# Patient Record
Sex: Female | Born: 1935 | Race: White | Hispanic: No | Smoking: Former smoker
Health system: Southern US, Community
[De-identification: ages and names within clinical notes are randomized; demographics above are authoritative.]

## PROBLEM LIST (undated history)

## (undated) DIAGNOSIS — D649 Anemia, unspecified: Secondary | ICD-10-CM

## (undated) DIAGNOSIS — B029 Zoster without complications: Secondary | ICD-10-CM

## (undated) DIAGNOSIS — I351 Nonrheumatic aortic (valve) insufficiency: Secondary | ICD-10-CM

## (undated) DIAGNOSIS — G43909 Migraine, unspecified, not intractable, without status migrainosus: Secondary | ICD-10-CM

## (undated) DIAGNOSIS — A809 Acute poliomyelitis, unspecified: Secondary | ICD-10-CM

## (undated) DIAGNOSIS — I10 Essential (primary) hypertension: Secondary | ICD-10-CM

## (undated) HISTORY — DX: Zoster without complications: B02.9

## (undated) HISTORY — DX: Nonrheumatic aortic (valve) insufficiency: I35.1

## (undated) HISTORY — DX: Migraine, unspecified, not intractable, without status migrainosus: G43.909

## (undated) HISTORY — DX: Anemia, unspecified: D64.9

## (undated) HISTORY — DX: Essential (primary) hypertension: I10

---
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
## 1963-05-26 HISTORY — PX: OTHER SURGICAL HISTORY: SHX169

## 2010-05-15 ENCOUNTER — Encounter: Payer: Self-pay | Admitting: Internal Medicine

## 2010-05-15 ENCOUNTER — Inpatient Hospital Stay (HOSPITAL_COMMUNITY)
Admission: EM | Admit: 2010-05-15 | Discharge: 2010-05-20 | Payer: Self-pay | Source: Home / Self Care | Attending: Internal Medicine | Admitting: Internal Medicine

## 2010-05-15 LAB — CONVERTED CEMR LAB
ALT: 22 units/L
AST: 35 units/L
Albumin: 4.1 g/dL
Alkaline Phosphatase: 50 units/L
BUN: 17 mg/dL
CO2: 25 meq/L
Calcium: 9.4 mg/dL
Chloride: 99 meq/L
Creatinine, Ser: 0.93 mg/dL
Glucose, Bld: 100 mg/dL
Potassium: 3.3 meq/L
Sodium: 134 meq/L
Total Bilirubin: 0.4 mg/dL
Total Protein: 5.9 g/dL

## 2010-05-16 ENCOUNTER — Encounter (INDEPENDENT_AMBULATORY_CARE_PROVIDER_SITE_OTHER): Payer: Self-pay | Admitting: Internal Medicine

## 2010-05-16 ENCOUNTER — Encounter: Payer: Self-pay | Admitting: Internal Medicine

## 2010-05-16 LAB — CONVERTED CEMR LAB
Cholesterol: 174 mg/dL
HDL: 76 mg/dL
Hgb A1c MFr Bld: 5.6 %
LDL Cholesterol: 81 mg/dL
TSH: 0.626 microintl units/mL
Triglyceride fasting, serum: 83 mg/dL

## 2010-05-17 ENCOUNTER — Encounter: Payer: Self-pay | Admitting: Internal Medicine

## 2010-05-17 LAB — CONVERTED CEMR LAB
BUN: 14 mg/dL
CO2: 22 meq/L
Calcium: 9.2 mg/dL
Chloride: 100 meq/L
Creatinine, Ser: 0.97 mg/dL
Glucose, Bld: 100 mg/dL
HCT: 27.2 %
Hemoglobin: 8.6 g/dL
MCV: 60.9 fL
Platelets: 250 10*3/uL
Potassium: 3.6 meq/L
RBC: 4.47 M/uL
RDW: 18 %
Sodium: 132 meq/L
WBC: 8.2 10*3/uL

## 2010-05-20 ENCOUNTER — Encounter (INDEPENDENT_AMBULATORY_CARE_PROVIDER_SITE_OTHER): Payer: Self-pay | Admitting: *Deleted

## 2010-05-21 ENCOUNTER — Encounter (INDEPENDENT_AMBULATORY_CARE_PROVIDER_SITE_OTHER): Payer: Self-pay | Admitting: *Deleted

## 2010-05-28 ENCOUNTER — Telehealth (INDEPENDENT_AMBULATORY_CARE_PROVIDER_SITE_OTHER): Payer: Self-pay | Admitting: *Deleted

## 2010-05-29 ENCOUNTER — Encounter (HOSPITAL_COMMUNITY)
Admission: RE | Admit: 2010-05-29 | Discharge: 2010-06-24 | Payer: Self-pay | Source: Home / Self Care | Attending: Cardiology | Admitting: Cardiology

## 2010-05-29 ENCOUNTER — Encounter: Payer: Self-pay | Admitting: Internal Medicine

## 2010-05-29 ENCOUNTER — Ambulatory Visit: Admission: RE | Admit: 2010-05-29 | Discharge: 2010-05-29 | Payer: Self-pay | Source: Home / Self Care

## 2010-06-02 ENCOUNTER — Other Ambulatory Visit: Payer: Self-pay | Admitting: Physician Assistant

## 2010-06-02 ENCOUNTER — Ambulatory Visit
Admission: RE | Admit: 2010-06-02 | Discharge: 2010-06-02 | Payer: Self-pay | Source: Home / Self Care | Attending: Physician Assistant | Admitting: Physician Assistant

## 2010-06-02 DIAGNOSIS — R002 Palpitations: Secondary | ICD-10-CM | POA: Insufficient documentation

## 2010-06-02 DIAGNOSIS — I10 Essential (primary) hypertension: Secondary | ICD-10-CM | POA: Insufficient documentation

## 2010-06-02 DIAGNOSIS — E876 Hypokalemia: Secondary | ICD-10-CM | POA: Insufficient documentation

## 2010-06-02 DIAGNOSIS — D509 Iron deficiency anemia, unspecified: Secondary | ICD-10-CM | POA: Insufficient documentation

## 2010-06-02 LAB — BASIC METABOLIC PANEL
BUN: 25 mg/dL — ABNORMAL HIGH (ref 6–23)
CO2: 27 mEq/L (ref 19–32)
Calcium: 9.4 mg/dL (ref 8.4–10.5)
Chloride: 102 mEq/L (ref 96–112)
Creatinine, Ser: 0.8 mg/dL (ref 0.4–1.2)
GFR: 72.01 mL/min (ref >60.00)
Glucose, Bld: 87 mg/dL (ref 70–99)
Potassium: 4.4 mEq/L (ref 3.5–5.1)
Sodium: 136 mEq/L (ref 135–145)

## 2010-06-25 ENCOUNTER — Encounter: Payer: Self-pay | Admitting: Internal Medicine

## 2010-06-26 ENCOUNTER — Ambulatory Visit (INDEPENDENT_AMBULATORY_CARE_PROVIDER_SITE_OTHER): Payer: Medicare Other | Admitting: Internal Medicine

## 2010-06-26 ENCOUNTER — Other Ambulatory Visit: Payer: Self-pay | Admitting: Internal Medicine

## 2010-06-26 ENCOUNTER — Ambulatory Visit: Admit: 2010-06-26 | Payer: Self-pay | Admitting: Internal Medicine

## 2010-06-26 ENCOUNTER — Other Ambulatory Visit: Payer: Medicare Other

## 2010-06-26 ENCOUNTER — Encounter: Payer: Self-pay | Admitting: Internal Medicine

## 2010-06-26 DIAGNOSIS — I10 Essential (primary) hypertension: Secondary | ICD-10-CM | POA: Insufficient documentation

## 2010-06-26 DIAGNOSIS — D509 Iron deficiency anemia, unspecified: Secondary | ICD-10-CM

## 2010-06-26 DIAGNOSIS — Z87891 Personal history of nicotine dependence: Secondary | ICD-10-CM | POA: Insufficient documentation

## 2010-06-26 DIAGNOSIS — Z87448 Personal history of other diseases of urinary system: Secondary | ICD-10-CM | POA: Insufficient documentation

## 2010-06-26 LAB — CBC WITH DIFFERENTIAL/PLATELET
Basophils Absolute: 0 10*3/uL (ref 0.0–0.1)
Basophils Relative: 0.3 % (ref 0.0–3.0)
Eosinophils Absolute: 0 10*3/uL (ref 0.0–0.7)
Eosinophils Relative: 0.3 % (ref 0.0–5.0)
HCT: 35.5 % — ABNORMAL LOW (ref 36.0–46.0)
Hemoglobin: 12 g/dL (ref 12.0–15.0)
Lymphocytes Relative: 13.7 % (ref 12.0–46.0)
Lymphs Abs: 0.9 10*3/uL (ref 0.7–4.0)
MCHC: 33.7 g/dL (ref 30.0–36.0)
MCV: 76.6 fl — ABNORMAL LOW (ref 78.0–100.0)
Monocytes Absolute: 0.4 10*3/uL (ref 0.1–1.0)
Monocytes Relative: 5.9 % (ref 3.0–12.0)
Neutro Abs: 5.5 10*3/uL (ref 1.4–7.7)
Neutrophils Relative %: 79.8 % — ABNORMAL HIGH (ref 43.0–77.0)
Platelets: 223 10*3/uL (ref 150.0–400.0)
RBC: 4.64 Mil/uL (ref 3.87–5.11)
RDW: 30.3 % — ABNORMAL HIGH (ref 11.5–14.6)
WBC: 6.9 10*3/uL (ref 4.5–10.5)

## 2010-06-26 NOTE — Assessment & Plan Note (Signed)
Summary: eph. f/u on stress test. gd   Primary Provider:  Dr. Felicity Coyer (to est 06/2010)   History of Present Illness: Olivia Gay is a 77 yo female with a history of migraines and polio as a child who recently was evaluated at [facility] with complaints of palpitations.  She was diagnosed with iron deficiency anemia.  She also had hypokalemia noted and this was repleted.  Her CK MBs were mildly elevated but her indices were negative.  Her troponins were negative x4.  Cardiology was asked to see her.  She had an echocardiogram which demonstrated an EF of 65%, mild to moderate aortic insufficiency, mild mitral regurgitation, evidence of diastolic dysfunction and septal thickening greater than the posterior wall (no SAM was noted).  Recommendations were for followup with an outpatient Myoview study.  This was done 05/29/10.  This demonstrated an EF of 67% and no ischemia.  She is to followup with primary care for her iron deficiency anemia.   She was  treated for urinary tract infection.  Of note, it was suggested to proceed with outpatient monitoring should she have recurrent or persistent palpitations.  She returns today for followup.  LABS: K 4.4 and Creat 0.85 (12/25); Hgb 8.6 (12/24) During hosp 04/2010: TC 174, TG 83, HDL 76; LFTs ok; TSH 0.626 Chest CT 04/2010: no PE  She returns for followup.  She denies any chest discomfort or shortness of breath.  She denies orthopnea or PND.  She has some mild pedal edema.  She denies any further palpitations.  She sees her new primary care physician next month.  She's also been set up for an upper endoscopy and colonoscopy early next month.  She is taking iron.  Her son is with her and shows me a list of her blood pressures over the last couple weeks.  They have steadily come down to 150s over 70s.  Her gait is steady.  She is tolerating her medications well.  She has noticed some small bruising on her lower extremities.  Current Medications  (verified): 1)  Amlodipine Besylate 10 Mg Tabs (Amlodipine Besylate) .... Take One Tablet By Mouth Daily 2)  Aspirin 81 Mg Tbec (Aspirin) .... Take One Tablet By Mouth Daily 3)  Enalapril Maleate 10 Mg Tabs (Enalapril Maleate) .... Take One Tablet By Mouth Twice A Day 4)  Ferrous Sulfate 325 (65 Fe) Mg  Tabs (Ferrous Sulfate) .Marland Kitchen.. 1 Tab By Mouth Once Daily 5)  Lopressor 12.5 Mg .... 1/2 Tab Two Times A Day 6)  Vit C .... 1 Tab By Mouth Once Daily 7)  Vit D .... 1 Tab By Mouth Once Daily 8)  Fish Oil   Oil (Fish Oil) .Marland Kitchen.. 1 Tab By Mouth Once Daily  Allergies: No Known Drug Allergies  Past History:  Past Medical History: Migraines History of polio in childhood.  HTN Iron deficiency anemia Hyponatremia Echo 04/2010:   EF 65%; diast dysfxn; mild-mod AI; mild MR Myoview 05/2010: EF 67%; no ischemia  Past Surgical History: Reviewed history from 05/30/2010 and no changes required.  None.   Family History: Mother died from an MI at the age of 32.   Her father died of an MI in his late 51s.  Social History: Reviewed history from 05/30/2010 and no changes required.   [place], [place].  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  She has remote   history of smoking, but quit smoking over  50 years ago.  No history of   alcohol abuse or illicit drug use.      Review of Systems       As per  the HPI.  All other systems reviewed and negative.   Vital Signs:  Patient profile:   77 year old female Height:      67 inches Weight:      110 pounds BMI:     17.29 Pulse rate:   61 / minute Resp:     14 per minute BP sitting:   157 / 70  (left arm)  Vitals Entered By: Kem Parkinson (June 02, 2010 1:23 PM)  Physical Exam  General:  Well nourished, well developed, in no acute distress HEENT: normal Neck: no JVD Cardiac:  normal S1, S2; RRR; no murmur Lungs:  clear to auscultation bilaterally, no wheezing, rhonchi or rales Abd:  soft, nontender, no hepatomegaly Ext: trace edema Vascular: scattered purpura noted on bilat LE Skin: warm and dry Neuro:  CNs 2-12 intact, no focal abnormalities noted    Impression & Recommendations:  Problem # 1:  PALPITATIONS (ICD-785.1) This was in the setting of untreated high blood pressure, newly diagnosed anemia and hypokalemia.  She has not had any further palpitations.  She is currently on iron.  Her blood pressure is much better controlled.  She had a Myoview study last week.  As noted above, this demonstrated no ischemia.  Her ejection fraction is normal.  At this point, I do not think it is necessary to proceed with the event monitoring.  She knows to contact us if she develops recurrent palpitations.  She will be brought back in followup with Dr. Jens Som in the next 2 months. (She was seen by our cardiology fellow over the holiday weekend initially in the hospital.  She states that she did see Dr. Jens Som and Dr. Johney Frame on rounds during her stay.  Therefore, I have decided to bring her back to establish with Dr. Jens Som in the office.)  Problem # 2:  ESSENTIAL HYPERTENSION, BENIGN (ICD-401.1) BP is better controlled. Continue current medications.  With her advanced age and h/o unsteady gait (she had balance problems shortly after starting medications in the hospital), I would suggest her medications be slowly titrated.  Her BPs at home are consistently improving. Consider increasing her enalapril over time if her BP remains over 140/90.  Orders: TLB-BMP (Basic Metabolic Panel-BMET) (80048-METABOL)  Problem # 3:  ANEMIA, IRON DEFICIENCY (ICD-280.9) She is to see Dr. Felicity Coyer in Feb to establish. She has an EGD and Colo set up for early Feb as well. With a negative myoview and no objective evidence of CAD or ischemia, I suggest she d/c the ASA.  This should help clear up the purpura she has noticed recently.  Problem # 4:  HYPOKALEMIA (ICD-276.8) Repeat BMET today,  especially since she just started ACE inhibitor recently.  Patient Instructions: 1)  Your physician recommends that you schedule a follow-up appointment in: 2 months with Dr. Jens Som 2)  Your physician has recommended you make the following change in your medication: Stop aspirin.  Continue all other medications on your medication list. Prescriptions: ENALAPRIL MALEATE 10 MG TABS (ENALAPRIL MALEATE) Take one tablet by mouth twice a day  #60 x 6   Entered by:   Dossie Arbour, RN, BSN   Authorized by:   Tereso Newcomer PA-C   Signed by:   Dossie Arbour, RN, BSN on 06/02/2010   Method used:  Electronically to        [facility] [address] [phone]* (retail)       [address]       [place], [place]  [address]       Ph: [phone] or [phone]       Fax: [phone]   RxID:   [id number]  I have personally reviewed the prescriptions today for accuracy.Tereso Newcomer PA-C  June 02, 2010 3:56 PM

## 2010-06-26 NOTE — Progress Notes (Signed)
Summary: Nuclear Pre-Proocedure  Phone Note Outgoing Call   Call placed by: Milana Na, EMT-P,  May 28, 2010 3:54 PM Summary of Call: Left message with information on Myoview Information Sheet (see scanned document for details).      Nuclear Med Background Indications for Stress Test: Evaluation for Ischemia, Post Hospital  Indications Comments: 05/20/10 Palpitations, Uncontrolled HTN, UTI, and unsteady gait  History: Echo  History Comments: 12/11 ECHO EF 65%   Symptoms: Palpitations    Nuclear Pre-Procedure Cardiac Risk Factors: Family History - CAD, History of Smoking, Hypertension  Nuclear Med Study Referring MD:  B.Crenshaw

## 2010-06-26 NOTE — Letter (Signed)
Summary: New Patient letter  [facility]  [address] [place], [place] [address]   Phone: [phone]  Fax: [phone]       05/21/2010 MRN: [id number]  Olivia Gay [address] [place], [place]  [address]  Dear Olivia Gay,  Welcome to the Gastroenterology Division at [facility].    You are scheduled to see Dr.  Arlyce Dice on 07-02-10 at 10:45a.m. on the 3rd floor at [facility], [address].  We ask that you try to arrive at ouroffice 15 minutes prior to your appointment time to allow for check-in.  We would like you to complete the enclosed self-administered evaluation form prior to your visit and bring it with you on the day of your appointment.  We will review it with you.  Also, please bring a complete list of all your medications or, if you prefer, bring the medication bottles and we will list them.  Please bring your insurance card so that we may make a copy of it.  If your insurance requires a referral to see a specialist, please bring your referral form from your primary care physician.  Co-payments are due at the time of your visit and may be paid by cash, check or credit card.     Your office visit will consist of a consult with your physician (includes a physical exam), any laboratory testing he/she may order, scheduling of any necessary diagnostic testing (e.g. x-ray, ultrasound, CT-scan), and scheduling of a procedure (e.g. Endoscopy, Colonoscopy) if required.  Please allow enough time on your schedule to allow for any/all of these possibilities.    If you cannot keep your appointment, please call [phone] to cancel or reschedule prior to your appointment date.  This allows [place] the opportunity to schedule an appointment for another patient in need of care.  If you do not cancel or reschedule by 5 p.m. the business day prior to your appointment date, you will be charged a $50.00 late cancellation/no-show fee.    Thank you for choosing  [facility] for your medical needs.  We appreciate the opportunity to care for you.  Please visit [place] at our website  to learn more about our practice.                     Sincerely,                                                             The Gastroenterology Division

## 2010-06-26 NOTE — Assessment & Plan Note (Signed)
Summary: Cardiology Nuclear Testing  Nuclear Med Background Indications for Stress Test: Evaluation for Ischemia, Post Hospital  Indications Comments: 05/20/10 Palpitations, elevated CK-MB, normal troponin  History: Echo  History Comments: 12/11 Echo:EF 65%, mild-moderate AR, mild MR   Symptoms: Fatigue, Palpitations    Nuclear Pre-Procedure Cardiac Risk Factors: Family History - CAD, History of Smoking, Hypertension Caffeine/Decaff Intake: Decaff coffee at 6:00 am NPO After: 6:00 AM Lungs: Clear IV 0.9% NS with Angio Cath: 22g     IV Site: R Antecubital IV Started by: Irean Hong, RN Chest Size (in) 34     Cup Size B     Height (in): 67 Weight (lb): 107 BMI: 16.82 Tech Comments: Metoprolol held x 12 hours. Patient changed from Lexiscan to Bruce protocol due to caffeine intake.  Nuclear Med Study 1 or 2 day study:  1 day     Stress Test Type:  Stress Reading MD:  Dietrich Pates, MD     Referring MD:  Olga Millers, MD Resting Radionuclide:  Technetium 43m Tetrofosmin     Resting Radionuclide Dose:  11.0 mCi  Stress Radionuclide:  Technetium 50m Tetrofosmin     Stress Radionuclide Dose:  33.0 mCi   Stress Protocol Exercise Time (min):  4:00 min     Max HR:  122 bpm     Predicted Max HR:  139 bpm  Max Systolic BP: 177 mm Hg     Percent Max HR:  87.77 %     METS: 4.8 Rate Pressure Product:  56213    Stress Test Technologist:  Rea College, CMA-N     Nuclear Technologist:  Doyne Keel, CNMT  Rest Procedure  Myocardial perfusion imaging was performed at rest 45 minutes following the intravenous administration of Technetium 39m Tetrofosmin.  Stress Procedure  The patient exercised for four minutes.  The patient stopped due to fatigue and denied any chest pain.  There were no significant ST-T wave changes.  Technetium 33m Tetrofosmin was injected at peak exercise and myocardial perfusion imaging was performed after a brief delay.  QPS Raw Data Images:  Normal; no motion  artifact; normal heart/lung ratio. Stress Images:  Normal homogeneous uptake in all areas of the myocardium. Rest Images:  Normal homogeneous uptake in all areas of the myocardium. Subtraction (SDS):  No evidence of ischemia. Transient Ischemic Dilatation:  1.04  (Normal <1.22)  Lung/Heart Ratio:  0.22  (Normal <0.45)  Quantitative Gated Spect Images QGS EDV:  76 ml QGS ESV:  25 ml QGS EF:  67 %   Overall Impression  Exercise Capacity: Fair exercise capacity. BP Response: Normal blood pressure response. Clinical Symptoms: No chest pain ECG Impression: No significant ST segment change suggestive of ischemia. Overall Impression: Normal stress nuclear study.  Appended Document: Cardiology Nuclear Testing not my pt; forward to ordering physician

## 2010-07-02 ENCOUNTER — Encounter: Payer: Self-pay | Admitting: Gastroenterology

## 2010-07-02 ENCOUNTER — Ambulatory Visit (INDEPENDENT_AMBULATORY_CARE_PROVIDER_SITE_OTHER): Payer: Medicare Other | Admitting: Gastroenterology

## 2010-07-02 DIAGNOSIS — D509 Iron deficiency anemia, unspecified: Secondary | ICD-10-CM
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
## 2010-07-03 ENCOUNTER — Encounter (INDEPENDENT_AMBULATORY_CARE_PROVIDER_SITE_OTHER): Payer: Self-pay | Admitting: *Deleted

## 2010-07-03 ENCOUNTER — Other Ambulatory Visit: Payer: Medicare Other

## 2010-07-03 ENCOUNTER — Other Ambulatory Visit: Payer: Self-pay | Admitting: Gastroenterology

## 2010-07-03 DIAGNOSIS — Z1289 Encounter for screening for malignant neoplasm of other sites: Secondary | ICD-10-CM

## 2010-07-03 LAB — HEMOCCULT SLIDES (X 3 CARDS)
Fecal Occult Blood: NEGATIVE
OCCULT 2: NEGATIVE
OCCULT 3: NEGATIVE
OCCULT 5: NEGATIVE

## 2010-07-10 ENCOUNTER — Encounter: Payer: Medicare Other | Admitting: Gastroenterology

## 2010-07-10 NOTE — Letter (Signed)
Summary: Results Letter  [facility]  [address] [place], [place] [address]   Phone: [phone]  Fax: [phone]        July 02, 2010 MRN: [id number]    [facility] [address] [place], [place]  [address]    Dear Ms. Prunty,  It is my pleasure to have treated you recently as a new patient in my office. I appreciate your confidence and the opportunity to participate in your care.  Since I do have a busy inpatient endoscopy schedule and office schedule, my office hours vary weekly. I am, however, available for emergency calls everyday through my office. If I am not available for an urgent office appointment, another one of our gastroenterologist will be able to assist you.  My well-trained staff are prepared to help you at all times. For emergencies after office hours, a physician from our Gastroenterology section is always available through my 24 hour answering service  Once again I welcome you as a new patient and I look forward to a happy and healthy relationship             Sincerely,  Louis Meckel MD  This letter has been electronically signed by your physician.  Appended Document: Results Letter LETTER MAILED
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
## 2010-07-10 NOTE — Letter (Signed)
Summary: [facility] Instructions  [facility]  [address] [place], [place] [address]   Phone: [phone]  Fax: [phone]       LUCILL MAUCK    75-17-1928    MRN: [id number]        Procedure Day /Date:THURSDAY 07/10/2010     Arrival Time:2:30PM     Procedure Time:3:30PM     Location of Procedure:                    X   [facility] (4th Floor)                        PREPARATION FOR COLONOSCOPY WITH MOVIPREP   Starting 5 days prior to your procedure 07/05/2010  do not eat nuts, seeds, popcorn, corn, beans, peas,  salads, or any raw vegetables.  Do not take any fiber supplements (e.g. Metamucil, Citrucel, and Benefiber).  THE DAY BEFORE YOUR PROCEDURE         DATE: 07/09/2010  DAY: WED  1.  Drink clear liquids the entire day-NO SOLID FOOD  2.  Do not drink anything colored red or purple.  Avoid juices with pulp.  No orange juice.  3.  Drink at least 64 oz. (8 glasses) of fluid/clear liquids during the day to prevent dehydration and help the prep work efficiently.  CLEAR LIQUIDS INCLUDE: Water Jello Ice Popsicles Tea (sugar ok, no milk/cream) Powdered fruit flavored drinks Coffee (sugar ok, no milk/cream) Gatorade Juice: apple, white grape, white cranberry  Lemonade Clear bullion, consomm, broth Carbonated beverages (any kind) Strained chicken noodle soup Hard Candy                             4.  In the morning, mix first dose of MoviPrep solution:    Empty 1 Pouch A and 1 Pouch B into the disposable container    Add lukewarm drinking water to the top line of the container. Mix to dissolve    Refrigerate (mixed solution should be used within 24 hrs)  5.  Begin drinking the prep at 5:00 p.m. The MoviPrep container is divided by 4 marks.   Every 15 minutes drink the solution down to the next mark (approximately 8 oz) until the full liter is complete.   6.  Follow completed prep with 16 oz of clear liquid of your choice (Nothing  red or purple).  Continue to drink clear liquids until bedtime.  7.  Before going to bed, mix second dose of MoviPrep solution:    Empty 1 Pouch A and 1 Pouch B into the disposable container    Add lukewarm drinking water to the top line of the container. Mix to dissolve    Refrigerate  THE DAY OF YOUR PROCEDURE      DATE: 07/10/2010 DAY: THURSDAY  Beginning at10:30a.m. (5 hours before procedure):         1. Every 15 minutes, drink the solution down to the next mark (approx 8 oz) until the full liter is complete.  2. Follow completed prep with 16 oz. of clear liquid of your choice.    3. You may drink clear liquids until 1:30PM  (2 HOURS BEFORE PROCEDURE).   MEDICATION INSTRUCTIONS  Unless otherwise instructed, you should take regular prescription medications with a small sip of water   as early as possible the morning of your procedure.  OTHER INSTRUCTIONS  You will need a responsible adult at least 75 years of age to accompany you and drive you home.   This person must remain in the waiting room during your procedure.  Wear loose fitting clothing that is easily removed.  Leave jewelry and other valuables at home.  However, you may wish to bring a book to read or  an iPod/MP3 player to listen to music as you wait for your procedure to start.  Remove all body piercing jewelry and leave at home.  Total time from sign-in until discharge is approximately 2-3 hours.  You should go home directly after your procedure and rest.  You can resume normal activities the  day after your procedure.  The day of your procedure you should not:   Drive   Make legal decisions   Operate machinery   Drink alcohol   Return to work  You will receive specific instructions about eating, activities and medications before you leave.    The above instructions have been reviewed and explained to me by   _______________________    I fully understand and can verbalize these  instructions _____________________________ Date _________

## 2010-07-10 NOTE — Discharge Summary (Signed)
Olivia Gay, Olivia Gay             ACCOUNT NO.:  [id number]      MEDICAL RECORD NO.:  [id number]          PATIENT TYPE:  INP      LOCATION:  2038                         FACILITY:  [facility]      PHYSICIAN:  Calvert Cantor, M.D.     DATE OF BIRTH:  November 07, 1926      DATE OF ADMISSION:  05/15/2010   DATE OF DISCHARGE:                                  DISCHARGE SUMMARY         PRIMARY CARE PHYSICIAN:  The patient does not have one.      She is being referred to Dr. Rene Paci at [facility].      IN-HOSPITAL CONSULTS:  Cardiology consult with [facility].      CHIEF COMPLAINT:  Palpitations.      DISCHARGE DIAGNOSES:   1. Palpitations, possibly secondary to premature ventricular       contractions.   2. Uncontrolled high blood pressure on admission.   3. Anemia, microcytic with iron deficiency.  The patient will need a       GI workup as an outpatient.   4. Unsteady gait.  The patient will need home health PT.  She is being       discharged with a walker.   5. Mild hyponatremia.   6. Grade 2 diastolic dysfunction with moderate left ventricular       hypertrophy.   7. Escherichia coli urinary tract infection, treated with 3 days of       Rocephin.      DISCHARGE MEDICATIONS:   1. Amlodipine 10 mg daily as prescribed by [facility].   2. Aspirin 81 mg daily.   3. Enalapril 10 mg twice a day.   4. Ferrous sulfate 325 mg twice a day.   5. Metoprolol 12.5 mg twice a day.   6. Fish oil over-the-counter 1 capsule daily.   7. Vitamin C over-the-counter 1 tablet daily.   8. Vitamin D over-the-counter 1 tablet daily.      PERTINENT IMAGING RESULTS:   1. Chest x-ray, two-view on December 22 revealed pectus excavatum,       upper thoracic scoliosis, and degree of pulmonary hyperinflation.   2. CTA of the chest performed on December 23 was negative for PE.  She       had some right upper lobe pleural parenchymal scarring and a       moderate pectus excavatum.   3. A 2-D  echo performed on May 16, 2010, revealed LV ejection       fraction of 65% with possible hypokinesis of the posterior wall at       the base.  Septum is bigger than the posterior wall.  There is no       SAM and no LVOT gradient.   4. Doppler parameters reveal high ventricular filling pressures.       Aortic valve reveals mild-to-moderate regurgitation, mitral valve       reveals mild regurgitation.  PAT pressure was 45.  She does have       LVH and grade  2 diastolic dysfunction.      PERTINENT LAB FINDINGS:  Hemoglobin of 8.6, hematocrit 27.2, low iron   levels with iron of 10.  Iron binding capacity was 361, percent   saturation was 3.  Ferritin level was 10.  B12 level was high normal at   1243. Serum folate was normal at 13.9.      Sodium level has remained slightly low at 132 and 134.      HOSPITAL COURSE:  This is an 77 year old female who does not have a PCP.   The patient had not seen a physician for about 20 years.  She decided to   come to the ER on December 23 when she noted palpitations that were   lasting for about 2-5 minutes and were self-limiting.      The patient was admitted for her complaint of palpitations.  Cardiac   enzymes were followed.  A 2-D echo was ordered and a Cardiology consult   was recommended.      The patient was evaluated by [facility].  Initial consult was   performed by Dr. Mordecai Maes.  Per Cardiology eval, it has been decided that   the patient needs an outpatient stress test.  If palpitations continue,   she will need a Holter monitor.  During her hospital stay, the patient   has not had any further complaints of palpitations, but has been noted   to have PVCs on the tele monitor.  Initial EKG revealed sinus rhythm at   68 beats per minute with LVH and a prolonged QTc at 512 milliseconds.   Her cardiac enzymes revealed elevated CK-MB; however, troponins were   normal.  Echo findings are mentioned above.  As recommended by    Cardiology, she has been set up for an outpatient Myoview stress test   and will need to follow up with [facility] in 1 week.      The patient's second issue was her blood pressure which was quite   uncontrolled, at times reaching a systolic of 200 and diastolics in the   90s.  This was difficult to control and multiple modifications were made   with her medications.  On the above-mentioned regimen that she is being   discharged on, her pressures have been about 150/80s, heart rate is in   the 60s.  Therefore, I would recommend that Lopressor is not titrated up   any further.  If needed, hydralazine can be added or HCTZ.  However,   sodium is already slightly low, and therefore hydralazine may not be a   good option.      The patient did have some episodes of unstable gait during the hospital   stay, and therefore a PT consult was requested.  The patient has a   history of polio and has some trouble walking in general, but was not as   unsteady in the past as she has been recently.  PT has recommended home   health, which I will be ordering for her.  She will also be discharged   with a rolling walker.      The patient was also found to be significantly anemic with a hemoglobin   of about 8, iron levels are low, therefore signifying possible GI bleed.   Stool occults were not performed during this hospital stay, however, it   is recommended that they be performed as an outpatient and the patient   be referred for outpatient GI eval as  she is in her 16s and has never   had a colonoscopy, this would definitely be recommended.      PHYSICAL EXAMINATION:  LUNGS:  Clear.   HEART:  Regular rate and rhythm.  She does have a mild aortic murmur,   which is 2/6.   ABDOMEN:  Soft, nontender, nondistended.  Bowel sounds positive.  No   organomegaly.  EXTREMITIES:  No cyanosis, clubbing or edema.      CONDITION ON DISCHARGE:  Stable.      FOLLOWUP INSTRUCTIONS:   1. I have given her  the phone number for Dr. Rene Paci.  The       patient needs to make the appointment herself.   2. A stress test will be ordered.  She will be called by [facility]       Cardiology with a date.   3. The patient also needs to follow up with [facility].      If palpitations continue, outpatient Holter monitor or event monitor is   recommended.      TIME ON DISCHARGE TODAY:  65 minutes.               Calvert Cantor, M.D.               SR/MEDQ  D:  05/20/2010  T:  05/20/2010  Job:  [id number]      cc:   Vikki Ports A. Felicity Coyer, MD   [facility] Cardiology      Electronically Signed by Calvert Cantor M.D. on 05/20/2010 02:05:28 PM

## 2010-08-04 LAB — CBC
HCT: 27.2 % — ABNORMAL LOW (ref 36.0–46.0)
MCH: 19.2 pg — ABNORMAL LOW (ref 26.0–34.0)
MCHC: 31.5 g/dL (ref 30.0–36.0)
MCHC: 31.6 g/dL (ref 30.0–36.0)
MCV: 60.9 fL — ABNORMAL LOW (ref 78.0–100.0)
Platelets: 250 10*3/uL (ref 150–400)
Platelets: 254 10*3/uL (ref 150–400)
RDW: 18 % — ABNORMAL HIGH (ref 11.5–15.5)
RDW: 18.3 % — ABNORMAL HIGH (ref 11.5–15.5)
WBC: 8.2 10*3/uL (ref 4.0–10.5)
WBC: 8.6 10*3/uL (ref 4.0–10.5)

## 2010-08-04 LAB — URINALYSIS, ROUTINE W REFLEX MICROSCOPIC
Glucose, UA: NEGATIVE mg/dL
Protein, ur: NEGATIVE mg/dL

## 2010-08-04 LAB — POCT CARDIAC MARKERS
CKMB, poc: 2.5 ng/mL (ref 1.0–8.0)
CKMB, poc: 3.1 ng/mL (ref 1.0–8.0)
Myoglobin, poc: 288 ng/mL (ref 12–200)
Myoglobin, poc: 288 ng/mL (ref 12–200)

## 2010-08-04 LAB — DIFFERENTIAL
Eosinophils Absolute: 0 10*3/uL (ref 0.0–0.7)
Eosinophils Relative: 0 % (ref 0–5)
Lymphs Abs: 1.9 10*3/uL (ref 0.7–4.0)
Monocytes Absolute: 0.5 10*3/uL (ref 0.1–1.0)
Monocytes Relative: 6 % (ref 3–12)
Neutrophils Relative %: 71 % (ref 43–77)

## 2010-08-04 LAB — COMPREHENSIVE METABOLIC PANEL
ALT: 22 U/L (ref 0–35)
AST: 35 U/L (ref 0–37)
Albumin: 4.1 g/dL (ref 3.5–5.2)
Calcium: 9.4 mg/dL (ref 8.4–10.5)
GFR calc Af Amer: >60 mL/min (ref >60)
Sodium: 134 mEq/L — ABNORMAL LOW (ref 135–145)
Total Protein: 5.9 g/dL — ABNORMAL LOW (ref 6.0–8.3)

## 2010-08-04 LAB — LIPID PANEL
Cholesterol: 174 mg/dL (ref 0–200)
Total CHOL/HDL Ratio: 2.3 RATIO

## 2010-08-04 LAB — URINE CULTURE

## 2010-08-04 LAB — CROSSMATCH
ABO/RH(D): O NEG
Antibody Screen: NEGATIVE

## 2010-08-04 LAB — CARDIAC PANEL(CRET KIN+CKTOT+MB+TROPI)
CK, MB: 5.2 ng/mL — ABNORMAL HIGH (ref 0.3–4.0)
Relative Index: 2.2 (ref 0.0–2.5)
Relative Index: 2.5 (ref 0.0–2.5)
Troponin I: 0.02 ng/mL (ref 0.00–0.06)
Troponin I: 0.03 ng/mL (ref 0.00–0.06)
Troponin I: 0.04 ng/mL (ref 0.00–0.06)

## 2010-08-04 LAB — URINE MICROSCOPIC-ADD ON

## 2010-08-04 LAB — BASIC METABOLIC PANEL
BUN: 14 mg/dL (ref 6–23)
CO2: 22 mEq/L (ref 19–32)
CO2: 25 mEq/L (ref 19–32)
Chloride: 100 mEq/L (ref 96–112)
Creatinine, Ser: 0.97 mg/dL (ref 0.4–1.2)
GFR calc Af Amer: >60 mL/min (ref >60)
Glucose, Bld: 100 mg/dL — ABNORMAL HIGH (ref 70–99)
Glucose, Bld: 104 mg/dL — ABNORMAL HIGH (ref 70–99)
Potassium: 4.4 mEq/L (ref 3.5–5.1)
Sodium: 134 mEq/L — ABNORMAL LOW (ref 135–145)

## 2010-08-04 LAB — HEMOGLOBIN A1C
Hgb A1c MFr Bld: 5.6 % (ref <5.7)
Mean Plasma Glucose: 114 mg/dL (ref <117)

## 2010-08-04 LAB — VITAMIN B12: Vitamin B-12: 1243 pg/mL — ABNORMAL HIGH (ref 211–911)

## 2010-08-04 LAB — TSH: TSH: 0.626 u[IU]/mL (ref 0.350–4.500)

## 2010-08-04 LAB — D-DIMER, QUANTITATIVE: D-Dimer, Quant: 0.6 ug/mL-FEU — ABNORMAL HIGH (ref 0.00–0.48)

## 2010-08-04 LAB — IRON AND TIBC: UIBC: 351 ug/dL

## 2010-08-04 LAB — BRAIN NATRIURETIC PEPTIDE: Pro B Natriuretic peptide (BNP): 221 pg/mL — ABNORMAL HIGH (ref 0.0–100.0)

## 2010-08-05 DIAGNOSIS — A809 Acute poliomyelitis, unspecified: Secondary | ICD-10-CM | POA: Insufficient documentation

## 2010-08-05 DIAGNOSIS — G43909 Migraine, unspecified, not intractable, without status migrainosus: Secondary | ICD-10-CM | POA: Insufficient documentation

## 2010-09-08 ENCOUNTER — Ambulatory Visit (INDEPENDENT_AMBULATORY_CARE_PROVIDER_SITE_OTHER): Payer: Medicare Other | Admitting: Cardiology

## 2010-09-08 ENCOUNTER — Encounter: Payer: Self-pay | Admitting: Cardiology

## 2010-09-08 VITALS — BP 153/76 | HR 66 | Ht 66.0 in | Wt 112.0 lb

## 2010-09-08 DIAGNOSIS — I10 Essential (primary) hypertension: Secondary | ICD-10-CM

## 2010-09-08 DIAGNOSIS — I351 Nonrheumatic aortic (valve) insufficiency: Secondary | ICD-10-CM | POA: Insufficient documentation

## 2010-09-08 MED ORDER — ENALAPRIL MALEATE 20 MG PO TABS: 20.0000 mg | ORAL_TABLET | Freq: Every day | ORAL | Status: DC

## 2010-09-08 NOTE — Assessment & Plan Note (Signed)
No further symptoms. Continue beta blocker.
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
## 2010-09-08 NOTE — Patient Instructions (Signed)
Your physician recommends that you schedule a follow-up appointment in: ONE YEAR  INCREASE ENALAPRIL 20MG  ONCE DAILY  Your physician recommends that you return for lab work in: ONE WEEK

## 2010-09-08 NOTE — Assessment & Plan Note (Signed)
Blood pressure mildly elevated. Increase enalapril to 20 mg daily. Check potassium and renal function in one week. 

## 2010-09-08 NOTE — Assessment & Plan Note (Signed)
Optimize blood pressure control. Followup echocardiogram in the future.

## 2010-09-17 ENCOUNTER — Other Ambulatory Visit (INDEPENDENT_AMBULATORY_CARE_PROVIDER_SITE_OTHER): Payer: Medicare Other | Admitting: *Deleted

## 2010-09-17 DIAGNOSIS — I10 Essential (primary) hypertension: Secondary | ICD-10-CM

## 2010-09-17 LAB — BASIC METABOLIC PANEL
BUN: 16 mg/dL (ref 6–23)
Calcium: 9.7 mg/dL (ref 8.4–10.5)
Creatinine, Ser: 0.9 mg/dL (ref 0.4–1.2)
GFR: 65.39 mL/min (ref >60.00)
Glucose, Bld: 70 mg/dL (ref 70–99)
Potassium: 4 mEq/L (ref 3.5–5.1)

## 2010-09-19 ENCOUNTER — Telehealth: Payer: Self-pay | Admitting: Cardiology

## 2010-09-19 NOTE — Telephone Encounter (Signed)
Pt aware of lab results Olivia Gay

## 2010-09-19 NOTE — Telephone Encounter (Signed)
Pt requesting call for lab results. °

## 2010-09-24 ENCOUNTER — Telehealth: Payer: Self-pay | Admitting: Internal Medicine

## 2010-09-24 ENCOUNTER — Ambulatory Visit (INDEPENDENT_AMBULATORY_CARE_PROVIDER_SITE_OTHER): Payer: Medicare Other | Admitting: Internal Medicine

## 2010-09-24 ENCOUNTER — Other Ambulatory Visit (INDEPENDENT_AMBULATORY_CARE_PROVIDER_SITE_OTHER): Payer: Medicare Other

## 2010-09-24 ENCOUNTER — Encounter: Payer: Self-pay | Admitting: Internal Medicine

## 2010-09-24 DIAGNOSIS — I10 Essential (primary) hypertension: Secondary | ICD-10-CM

## 2010-09-24 DIAGNOSIS — I359 Nonrheumatic aortic valve disorder, unspecified: Secondary | ICD-10-CM

## 2010-09-24 DIAGNOSIS — D509 Iron deficiency anemia, unspecified: Secondary | ICD-10-CM

## 2010-09-24 DIAGNOSIS — I351 Nonrheumatic aortic (valve) insufficiency: Secondary | ICD-10-CM

## 2010-09-24 LAB — CBC WITH DIFFERENTIAL/PLATELET
Basophils Absolute: 0 10*3/uL (ref 0.0–0.1)
Eosinophils Relative: 0.2 % (ref 0.0–5.0)
HCT: 38.9 % (ref 36.0–46.0)
Lymphocytes Relative: 22.7 % (ref 12.0–46.0)
Monocytes Relative: 6.4 % (ref 3.0–12.0)
Neutrophils Relative %: 70.4 % (ref 43.0–77.0)
Platelets: 230 10*3/uL (ref 150.0–400.0)
RDW: 14.4 % (ref 11.5–14.6)
WBC: 6.9 10*3/uL (ref 4.5–10.5)

## 2010-09-24 NOTE — Assessment & Plan Note (Signed)
Echo 04/2010: EF 65%; diat dysfx - mild-mod AI -  No symptoms - follow up as per cards, future echo to monitor for same

## 2010-09-24 NOTE — Assessment & Plan Note (Signed)
Dx during hosp 04/2010 but no evidence for persisitng dz on follow up labs Declined GI eval for colo given FOB (-) x 5 spring 2012 Recheck CBC now,  Continue iron pills Weight loss trend reversed with correction of dental problems 05/2010 Lab Results  Component Value Date   WBC 6.9 06/26/2010   HGB 12.0 06/26/2010   HCT 35.5* 06/26/2010   MCV 76.6* 06/26/2010   PLT 223.0 06/26/2010

## 2010-09-24 NOTE — Assessment & Plan Note (Signed)
Well controlled on current medications- continue same No further palpitations (following hosp 04/2010 for same)  BP Readings from Last 3 Encounters:  09/24/10 110/72  09/08/10 153/76  07/02/10 120/62

## 2010-09-24 NOTE — Patient Instructions (Signed)
It was good to see you today. Test(s) ordered today. Your results will be called to you after review (48-72hours after test completion). If any changes need to be made, you will be notified at that time. Medications reviewed, no changes at this time. Call if refills needed Please schedule followup in 4-6 months, call sooner if problems.

## 2010-09-24 NOTE — Progress Notes (Signed)
Subjective:    Patient ID: Olivia Gay, female    DOB: Aug 30, 1926, 77 y.o.   MRN: [id number]  HPI  Here for follow up - reviewed chronic medical issues:  HTN- reports compliance with ongoing medical treatment and no changes in medication dose or frequency. denies adverse side effects related to current therapy.   anemia, iron def - incidental dx 04/2010 hosp -  no known hx same -  denies melena, brbpr or change in bowels -  no prior GI eval - no risks for PUD (no tobacco, alcohol or nsaid use) -  taking iron pills as rx'd - declined colo 06/2010 due to -FOB series and resolution of anemia on lab recheck  weight loss - trend now reversed with dental work 05/2010 - better appetitie and intake usual 126#, lowest 106#, now up to 111# -  Past Medical History  Diagnosis Date  . Hypertension   . Migraines   . Anemia      Review of Systems  Constitutional: Negative for fatigue.  Respiratory: Negative for shortness of breath.   Cardiovascular: Negative for chest pain.  Musculoskeletal: Negative for back pain.  Neurological: Negative for dizziness, syncope and numbness.       Objective:   Physical Exam BP 110/72  Pulse 73  Temp(Src) 98.8 F (37.1 C) (Oral)  Ht 5\' 7"  (1.702 m)  Wt 111 lb (50.349 kg)  BMI 17.38 kg/m2  SpO2 96% Physical Exam  Constitutional: She is oriented to person, place, and time. She appears well-developed and well-nourished. No distress.  Neck: Normal range of motion. Neck supple. No JVD present. No thyromegaly present.  Cardiovascular: Normal rate, regular rhythm and normal heart sounds.  No murmur heard. Pulmonary/Chest: Effort normal and breath sounds normal. No respiratory distress. She has no wheezes.  Neurological: She is alert and oriented to person, place, and time. No cranial nerve deficit. Coordination normal.  Skin: Skin is warm and dry. No rash noted. No erythema.  Psychiatric: She has a normal mood and affect. Her behavior is normal.  Judgment and thought content normal.   Lab Results  Component Value Date   WBC 6.9 06/26/2010   HGB 12.0 06/26/2010   HCT 35.5* 06/26/2010   PLT 223.0 06/26/2010   CHOL  Value: 174        ATP III CLASSIFICATION:  <200     mg/dL   Desirable  528-413  mg/dL   Borderline High  >=244    mg/dL   High        05/27/7251   TRIG 83 05/16/2010   HDL 76 05/16/2010   ALT 22 05/15/2010   AST 35 05/15/2010   NA 140 09/17/2010   K 4.0 09/17/2010   CL 103 09/17/2010   CREATININE 0.9 09/17/2010   BUN 16 09/17/2010   CO2 30 09/17/2010   TSH 0.626 05/16/2010   HGBA1C  Value: 5.6 (NOTE)                                                                       According to the ADA Clinical Practice Recommendations for 2011, when HbA1c is used as a screening test:   >=6.5%   Diagnostic of Diabetes Mellitus           (  if abnormal result  is confirmed)  5.7-6.4%   Increased risk of developing Diabetes Mellitus  References:Diagnosis and Classification of Diabetes Mellitus,Diabetes Care,2011,34(Suppl 1):S62-S69 and Standards of Medical Care in         Diabetes - 2011,Diabetes Care,2011,34  (Suppl 1):S11-S61. 05/16/2010        Assessment & Plan:  See problem list. Medications and labs reviewed today.

## 2010-09-24 NOTE — Telephone Encounter (Signed)
Please call patient - normal or stable test results. No anemia! No medication changes recommended. F/u 6 mo as planned, sooner if problems -Thanks.

## 2010-09-25 NOTE — Telephone Encounter (Signed)
Pt return call back gave results concerning labs..09/25/10@9 :07am/LMB

## 2010-09-25 NOTE — Telephone Encounter (Signed)
Called pt no ansew LMOM RTC concerning labs...09/25/10@ 8:51am/LMB

## 2011-04-01 ENCOUNTER — Encounter: Payer: Self-pay | Admitting: Internal Medicine

## 2011-04-01 ENCOUNTER — Ambulatory Visit (INDEPENDENT_AMBULATORY_CARE_PROVIDER_SITE_OTHER): Payer: Medicare Other | Admitting: Internal Medicine

## 2011-04-01 DIAGNOSIS — I10 Essential (primary) hypertension: Secondary | ICD-10-CM

## 2011-04-01 DIAGNOSIS — D509 Iron deficiency anemia, unspecified: Secondary | ICD-10-CM

## 2011-04-01 NOTE — Assessment & Plan Note (Signed)
Well controlled on current medications- continue same No further palpitations (following hosp 04/2010 for same)  BP Readings from Last 3 Encounters:  04/01/11 120/82  09/24/10 110/72  09/08/10 153/76

## 2011-04-01 NOTE — Assessment & Plan Note (Signed)
Dx during hosp 04/2010 but no evidence for persisitng dz on follow up labs Declined GI eval for colo given FOB (-) x 5 spring 2012 Continue iron pills Weight loss trend reversed with correction of dental problems 05/2010  Lab Results  Component Value Date   WBC 6.9 09/24/2010   HGB 13.2 09/24/2010   HCT 38.9 09/24/2010   MCV 85.4 09/24/2010   PLT 230.0 09/24/2010

## 2011-04-01 NOTE — Patient Instructions (Addendum)
It was good to see you today. We have reviewed your prior records including labs and tests today Health Maintenance reviewed - immunizations and colonoscopy declined today.  Please schedule followup in 6-12 months for blood pressure and weight check, call sooner if problems.

## 2011-04-01 NOTE — Progress Notes (Signed)
Subjective:    Patient ID: Olivia Gay, female    DOB: 28-Sep-1926, 77 y.o.   MRN: [id number]  HPI   Here for follow up - reviewed chronic medical issues:  HTN - reports compliance with ongoing medical treatment and no changes in medication dose or frequency. denies adverse side effects related to current therapy.   anemia, iron deficiency - incidental dx 04/2010 hosp -  no known hx same -  denies melena, brbpr or change in bowels -  no prior GI eval - no risks for PUD (no tobacco, alcohol or nsaid use) -  taking iron pills as rx'd -  declined colo 06/2010 due to -FOB series and resolution of anemia on lab recheck  weight loss - trend now reversed with dental work 05/2010 - better appetitie and intake usual 126#, lowest 106#, now up to 117# -  Past Medical History  Diagnosis Date  . Hypertension   . Migraines   . Anemia      Review of Systems  Constitutional: Negative for fatigue.  Respiratory: Negative for shortness of breath.   Cardiovascular: Negative for chest pain.  Musculoskeletal: Negative for back pain.  Neurological: Negative for dizziness, syncope and numbness.       Objective:   Physical Exam  BP 120/82  Pulse 64  Temp(Src) 97.8 F (36.6 C) (Oral)  Wt 117 lb (53.071 kg)  SpO2 98% Wt Readings from Last 3 Encounters:  04/01/11 117 lb (53.071 kg)  09/24/10 111 lb (50.349 kg)  09/08/10 112 lb (50.803 kg)   Constitutional: She appears well-developed and well-nourished. No distress.  Neck: Normal range of motion. Neck supple. No JVD present. No thyromegaly present.  Cardiovascular: Normal rate, regular rhythm and normal heart sounds.  No murmur heard. Pulmonary/Chest: Effort normal and breath sounds normal. No respiratory distress. She has no wheezes.  Neurological: She is alert and oriented to person, place, and time. No cranial nerve deficit. Coordination normal.  Psychiatric: Spry and bright for stated age;  normal mood and affect. Her behavior is  normal. Judgment and thought content normal.   Lab Results  Component Value Date   WBC 6.9 09/24/2010   HGB 13.2 09/24/2010   HCT 38.9 09/24/2010   PLT 230.0 09/24/2010   CHOL  Value: 174        ATP III CLASSIFICATION:  <200     mg/dL   Desirable  213-086  mg/dL   Borderline High  >=578    mg/dL   High        46/96/2952   TRIG 83 05/16/2010   HDL 76 05/16/2010   ALT 22 05/15/2010   AST 35 05/15/2010   NA 140 09/17/2010   K 4.0 09/17/2010   CL 103 09/17/2010   CREATININE 0.9 09/17/2010   BUN 16 09/17/2010   CO2 30 09/17/2010   TSH 0.626 05/16/2010   HGBA1C  Value: 5.6 (NOTE)                                                                       According to the ADA Clinical Practice Recommendations for 2011, when HbA1c is used as a screening test:   >=6.5%   Diagnostic of Diabetes Mellitus           (  if abnormal result  is confirmed)  5.7-6.4%   Increased risk of developing Diabetes Mellitus  References:Diagnosis and Classification of Diabetes Mellitus,Diabetes Care,2011,34(Suppl 1):S62-S69 and Standards of Medical Care in         Diabetes - 2011,Diabetes Care,2011,34  (Suppl 1):S11-S61. 05/16/2010        Assessment & Plan:  See problem list. Medications and labs reviewed today.

## 2011-06-11 ENCOUNTER — Other Ambulatory Visit: Payer: Self-pay | Admitting: Cardiology

## 2011-06-11 MED ORDER — AMLODIPINE BESYLATE 10 MG PO TABS: 10.0000 mg | ORAL_TABLET | Freq: Every day | ORAL | Status: DC

## 2011-07-09 ENCOUNTER — Other Ambulatory Visit: Payer: Self-pay | Admitting: Cardiology

## 2011-09-10 ENCOUNTER — Encounter: Payer: Self-pay | Admitting: Cardiology

## 2011-09-10 ENCOUNTER — Ambulatory Visit (INDEPENDENT_AMBULATORY_CARE_PROVIDER_SITE_OTHER): Payer: Medicare Other | Admitting: Cardiology

## 2011-09-10 VITALS — BP 120/68 | HR 57 | Resp 18 | Ht 65.0 in | Wt 120.8 lb

## 2011-09-10 DIAGNOSIS — R002 Palpitations: Secondary | ICD-10-CM | POA: Diagnosis not present

## 2011-09-10 DIAGNOSIS — I10 Essential (primary) hypertension: Secondary | ICD-10-CM

## 2011-09-10 DIAGNOSIS — I359 Nonrheumatic aortic valve disorder, unspecified: Secondary | ICD-10-CM | POA: Diagnosis not present

## 2011-09-10 DIAGNOSIS — I351 Nonrheumatic aortic (valve) insufficiency: Secondary | ICD-10-CM

## 2011-09-10 NOTE — Assessment & Plan Note (Signed)
Blood pressure controlled. Continue present medications. Potassium and renal function monitored by primary care. 
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
## 2011-09-10 NOTE — Assessment & Plan Note (Signed)
Plan repeat echocardiogram to assess aortic insufficiency.

## 2011-09-10 NOTE — Patient Instructions (Signed)

## 2011-09-10 NOTE — Assessment & Plan Note (Signed)
Controlled with beta blockade. Will continue.

## 2011-09-18 ENCOUNTER — Ambulatory Visit (HOSPITAL_COMMUNITY): Payer: Medicare Other | Attending: Internal Medicine

## 2011-09-18 ENCOUNTER — Other Ambulatory Visit: Payer: Self-pay

## 2011-09-18 DIAGNOSIS — I359 Nonrheumatic aortic valve disorder, unspecified: Secondary | ICD-10-CM | POA: Diagnosis not present

## 2011-09-18 DIAGNOSIS — Z87891 Personal history of nicotine dependence: Secondary | ICD-10-CM | POA: Diagnosis not present

## 2011-09-18 DIAGNOSIS — I059 Rheumatic mitral valve disease, unspecified: Secondary | ICD-10-CM | POA: Insufficient documentation

## 2011-09-18 DIAGNOSIS — I519 Heart disease, unspecified: Secondary | ICD-10-CM | POA: Insufficient documentation

## 2011-09-18 DIAGNOSIS — R011 Cardiac murmur, unspecified: Secondary | ICD-10-CM | POA: Insufficient documentation

## 2011-09-18 DIAGNOSIS — I351 Nonrheumatic aortic (valve) insufficiency: Secondary | ICD-10-CM

## 2011-09-18 DIAGNOSIS — I10 Essential (primary) hypertension: Secondary | ICD-10-CM | POA: Insufficient documentation

## 2011-09-22 ENCOUNTER — Telehealth: Payer: Self-pay | Admitting: Cardiology

## 2011-09-22 NOTE — Telephone Encounter (Signed)
Spoke with pt, aware of echo results, copy mailed to pt at her request.

## 2011-09-22 NOTE — Telephone Encounter (Signed)
New msg Pt wants to know her test results please call

## 2011-10-05 ENCOUNTER — Other Ambulatory Visit: Payer: Self-pay | Admitting: Cardiology

## 2012-04-06 ENCOUNTER — Encounter: Payer: Self-pay | Admitting: Internal Medicine

## 2012-04-06 ENCOUNTER — Ambulatory Visit (INDEPENDENT_AMBULATORY_CARE_PROVIDER_SITE_OTHER): Payer: Medicare Other | Admitting: Internal Medicine

## 2012-04-06 VITALS — BP 120/70 | HR 66 | Temp 97.1°F | Wt 121.8 lb

## 2012-04-06 DIAGNOSIS — Z0000 Encounter for general adult medical examination without abnormal findings: Secondary | ICD-10-CM | POA: Diagnosis not present

## 2012-04-06 DIAGNOSIS — I10 Essential (primary) hypertension: Secondary | ICD-10-CM

## 2012-04-06 DIAGNOSIS — I351 Nonrheumatic aortic (valve) insufficiency: Secondary | ICD-10-CM

## 2012-04-06 NOTE — Assessment & Plan Note (Signed)
Well controlled on current medications- continue same No further palpitations (following hosp 04/2010 for same)  BP Readings from Last 3 Encounters:  04/06/12 120/70  09/10/11 120/68  04/01/11 120/82

## 2012-04-06 NOTE — Progress Notes (Signed)
Subjective:    Patient ID: Olivia Gay, female    DOB: [birth year], 78 y.o.   MRN: [id number]  HPI  Here for medicare wellness  Diet: heart healthy  Physical activity: spry and active Depression/mood screen: negative Hearing: intact to slightly raised voice (declines hearing eval for hearing aides) Visual acuity: grossly normal, performs annual eye exam  ADLs: capable Fall risk: none Home safety: good Cognitive evaluation: intact to orientation, naming, recall and repetition EOL planning: adv directives, reviewed and I agree  I have personally reviewed and have noted 1. The patient's medical and social history 2. Their use of alcohol, tobacco or illicit drugs 3. Their current medications and supplements 4. The patient's functional ability including ADL's, fall risks, home safety risks and hearing or visual impairment. 5. Diet and physical activities 6. Evidence for depression or mood disorders   Also reviewed chronic medical issues:  HTN - reports compliance with ongoing medical treatment and no changes in medication dose or frequency. denies adverse side effects related to current therapy.   Hx anemia, iron deficiency - incidental dx 04/2010 hosp, resolved denies melena, brbpr or change in bowels -  no prior GI eval - no risks for PUD (no tobacco, alcohol or nsaid use) -  taking iron pills as rx'd -  declined colo 06/2010 due to neg FOB series and resolution of anemia on lab recheck  Prior weight loss - trend now reversed with dental work 05/2010 - better intake 2011 lowest 106#, now back up to 120s -(baseline/usual)  Past Medical History  Diagnosis Date  . Hypertension   . Migraines   . Anemia   . Aortic insufficiency     mild-mod, Echo 12/11and 4/13   Family History  Problem Relation Age of Onset  . Arthritis Mother   . Stroke Mother   . Hypertension Mother   . Diabetes Father   . Hypertension Father   . Breast cancer Sister   . Hyperlipidemia Sister     . Hypertension Sister    History  Substance Use Topics  . Smoking status: Former Smoker    Types: Cigarettes    Quit date: 05/25/1960  . Smokeless tobacco: Not on file  . Alcohol Use: No    Review of Systems  Constitutional: Negative for fatigue.  Respiratory: Negative for shortness of breath.   Cardiovascular: Negative for chest pain.  Musculoskeletal: Negative for back pain.  Neurological: Negative for dizziness, syncope and numbness.  No other specific complaints in a complete review of systems (except as listed in HPI above).      Objective:   Physical Exam  BP 120/70  Pulse 66  Temp 97.1 F (36.2 C) (Oral)  Wt 121 lb 12.8 oz (55.248 kg) Wt Readings from Last 3 Encounters:  04/06/12 121 lb 12.8 oz (55.248 kg)  09/10/11 120 lb 12.8 oz (54.795 kg)  04/01/11 117 lb (53.071 kg)   Constitutional: She appears well-developed and well-nourished. No distress. slight HOH Neck: Normal range of motion. Neck supple. No JVD present. No thyromegaly present.  Cardiovascular: Normal rate, regular rhythm and normal heart sounds.  No murmur heard. No BLE edema Pulmonary/Chest: Effort normal and breath sounds normal. No respiratory distress. She has no wheezes.  Neurological: She is alert and oriented to person, place, and time. No cranial nerve deficit. Coordination normal.  Psychiatric: Spry and bright for stated age;  normal mood and affect. Her behavior is normal. Judgment and thought content normal.   Lab Results  Component Value  Date   WBC 6.9 09/24/2010   HGB 13.2 09/24/2010   HCT 38.9 09/24/2010   PLT 230.0 09/24/2010   CHOL  Value: 174        ATP III CLASSIFICATION:  <200     mg/dL   Desirable  161-096  mg/dL   Borderline High  >=045    mg/dL   High        40/98/1191   TRIG 83 05/16/2010   HDL 76 05/16/2010   ALT 22 05/15/2010   AST 35 05/15/2010   NA 140 09/17/2010   K 4.0 09/17/2010   CL 103 09/17/2010   CREATININE 0.9 09/17/2010   BUN 16 09/17/2010   CO2 30 09/17/2010   TSH  0.626 05/16/2010   HGBA1C  Value: 5.6 (NOTE)                                                                       According to the ADA Clinical Practice Recommendations for 2011, when HbA1c is used as a screening test:   >=6.5%   Diagnostic of Diabetes Mellitus           (if abnormal result  is confirmed)  5.7-6.4%   Increased risk of developing Diabetes Mellitus  References:Diagnosis and Classification of Diabetes Mellitus,Diabetes Care,2011,34(Suppl 1):S62-S69 and Standards of Medical Care in         Diabetes - 2011,Diabetes Care,2011,34  (Suppl 1):S11-S61. 05/16/2010       Assessment & Plan:  AWV/v70.0 - Today patient counseled on age appropriate routine health concerns for screening and prevention, each reviewed and up to date or declined. Immunizations reviewed and up to date or declined. Labs/ECG reviewed. Risk factors for depression reviewed and negative. Hearing function and visual acuity are intact. ADLs screened and addressed as needed. Functional ability and level of safety reviewed and appropriate. Education, counseling and referrals performed based on assessed risks today. Patient provided with a copy of personalized plan for preventive services.  Also see problem list. Medications and labs reviewed today.

## 2012-04-06 NOTE — Patient Instructions (Signed)
It was good to see you today. We have reviewed your prior records including labs and tests today Medications reviewed, no changes at this time. Health Maintenance reviewed - all recommended immunizations and age-appropriate screenings are up-to-date or declined. Please schedule followup in 1 year, call sooner if problems.   Health Maintenance, Females A healthy lifestyle and preventative care can promote health and wellness.  Maintain regular health, dental, and eye exams.   Eat a healthy diet. Foods like vegetables, fruits, whole grains, low-fat dairy products, and lean protein foods contain the nutrients you need without too many calories. Decrease your intake of foods high in solid fats, added sugars, and salt. Get information about a proper diet from your caregiver, if necessary.   Regular physical exercise is one of the most important things you can do for your health. Most adults should get at least 150 minutes of moderate-intensity exercise (any activity that increases your heart rate and causes you to sweat) each week. In addition, most adults need muscle-strengthening exercises on 2 or more days a week.     Maintain a healthy weight. The body mass index (BMI) is a screening tool to identify possible weight problems. It provides an estimate of body fat based on height and weight. Your caregiver can help determine your BMI, and can help you achieve or maintain a healthy weight. For adults 20 years and older:   A BMI below 18.5 is considered underweight.   A BMI of 18.5 to 24.9 is normal.   A BMI of 25 to 29.9 is considered overweight.   A BMI of 30 and above is considered obese.   Maintain normal blood lipids and cholesterol by exercising and minimizing your intake of saturated fat. Eat a balanced diet with plenty of fruits and vegetables. Blood tests for lipids and cholesterol should begin at age 58 and be repeated every 5 years. If your lipid or cholesterol levels are high, you are  over 50, or you are a high risk for heart disease, you may need your cholesterol levels checked more frequently. Ongoing high lipid and cholesterol levels should be treated with medicines if diet and exercise are not effective.   If you smoke, find out from your caregiver how to quit. If you do not use tobacco, do not start.   If you are pregnant, do not drink alcohol. If you are breastfeeding, be very cautious about drinking alcohol. If you are not pregnant and choose to drink alcohol, do not exceed 1 drink per day. One drink is considered to be 12 ounces (355 mL) of beer, 5 ounces (148 mL) of wine, or 1.5 ounces (44 mL) of liquor.   Avoid use of street drugs. Do not share needles with anyone. Ask for help if you need support or instructions about stopping the use of drugs.   High blood pressure causes heart disease and increases the risk of stroke. Blood pressure should be checked at least every 1 to 2 years. Ongoing high blood pressure should be treated with medicines, if weight loss and exercise are not effective.   If you are 47 to 76 years old, ask your caregiver if you should take aspirin to prevent strokes.   Diabetes screening involves taking a blood sample to check your fasting blood sugar level. This should be done once every 3 years, after age 47, if you are within normal weight and without risk factors for diabetes. Testing should be considered at a younger age or be carried out  more frequently if you are overweight and have at least 1 risk factor for diabetes.   Breast cancer screening is essential preventative care for women. You should practice "breast self-awareness." This means understanding the normal appearance and feel of your breasts and may include breast self-examination. Any changes detected, no matter how small, should be reported to a caregiver. Women in their 67s and 30s should have a clinical breast exam (CBE) by a caregiver as part of a regular health exam every 1 to 3  years. After age 17, women should have a CBE every year. Starting at age 31, women should consider having a mammogram (breast X-ray) every year. Women who have a family history of breast cancer should talk to their caregiver about genetic screening. Women at a high risk of breast cancer should talk to their caregiver about having an MRI and a mammogram every year.   The Pap test is a screening test for cervical cancer. Women should have a Pap test starting at age 21. Between ages 13 and 53, Pap tests should be repeated every 2 years. Beginning at age 34, you should have a Pap test every 3 years as long as the past 3 Pap tests have been normal. If you had a hysterectomy for a problem that was not cancer or a condition that could lead to cancer, then you no longer need Pap tests. If you are between ages 110 and 15, and you have had normal Pap tests going back 10 years, you no longer need Pap tests. If you have had past treatment for cervical cancer or a condition that could lead to cancer, you need Pap tests and screening for cancer for at least 20 years after your treatment. If Pap tests have been discontinued, risk factors (such as a new sexual partner) need to be reassessed to determine if screening should be resumed. Some women have medical problems that increase the chance of getting cervical cancer. In these cases, your caregiver may recommend more frequent screening and Pap tests.   The human papillomavirus (HPV) test is an additional test that may be used for cervical cancer screening. The HPV test looks for the virus that can cause the cell changes on the cervix. The cells collected during the Pap test can be tested for HPV. The HPV test could be used to screen women aged 3 years and older, and should be used in women of any age who have unclear Pap test results. After the age of 60, women should have HPV testing at the same frequency as a Pap test.   Colorectal cancer can be detected and often  prevented. Most routine colorectal cancer screening begins at the age of 54 and continues through age 17. However, your caregiver may recommend screening at an earlier age if you have risk factors for colon cancer. On a yearly basis, your caregiver may provide home test kits to check for hidden blood in the stool. Use of a small camera at the end of a tube, to directly examine the colon (sigmoidoscopy or colonoscopy), can detect the earliest forms of colorectal cancer. Talk to your caregiver about this at age 30, when routine screening begins. Direct examination of the colon should be repeated every 5 to 10 years through age 71, unless early forms of pre-cancerous polyps or small growths are found.   Hepatitis C blood testing is recommended for all people born from [birth year] through 1963 and any individual with known risks for hepatitis C.   Practice  safe sex. Use condoms and avoid high-risk sexual practices to reduce the spread of sexually transmitted infections (STIs). Sexually active women aged 77 and younger should be checked for Chlamydia, which is a common sexually transmitted infection. Older women with new or multiple partners should also be tested for Chlamydia. Testing for other STIs is recommended if you are sexually active and at increased risk.   Osteoporosis is a disease in which the bones lose minerals and strength with aging. This can result in serious bone fractures. The risk of osteoporosis can be identified using a bone density scan. Women ages 5 and over and women at risk for fractures or osteoporosis should discuss screening with their caregivers. Ask your caregiver whether you should be taking a calcium supplement or vitamin D to reduce the rate of osteoporosis.   Menopause can be associated with physical symptoms and risks. Hormone replacement therapy is available to decrease symptoms and risks. You should talk to your caregiver about whether hormone replacement therapy is right for you.     Use sunscreen with a sun protection factor (SPF) of 30 or greater. Apply sunscreen liberally and repeatedly throughout the day. You should seek shade when your shadow is shorter than you. Protect yourself by wearing long sleeves, pants, a wide-brimmed hat, and sunglasses year round, whenever you are outdoors.   Notify your caregiver of new moles or changes in moles, especially if there is a change in shape or color. Also notify your caregiver if a mole is larger than the size of a pencil eraser.   Stay current with your immunizations.  Document Released: 11/24/2010 Document Revised: 08/03/2011 Document Reviewed: 11/24/2010 [facility] Patient Information 2013 [place], [place].

## 2012-04-06 NOTE — Assessment & Plan Note (Signed)
asymptomatic  Stable without progression on Echo 08/2011 compared to 12/2011echo myoview 05/2010 without ischemia Follows with cardiology annually

## 2012-05-25 DIAGNOSIS — B029 Zoster without complications: Secondary | ICD-10-CM

## 2012-05-25 HISTORY — DX: Zoster without complications: B02.9

## 2012-06-07 ENCOUNTER — Ambulatory Visit (INDEPENDENT_AMBULATORY_CARE_PROVIDER_SITE_OTHER): Payer: Medicare Other | Admitting: Internal Medicine

## 2012-06-07 ENCOUNTER — Encounter: Payer: Self-pay | Admitting: Internal Medicine

## 2012-06-07 VITALS — BP 120/78 | HR 80 | Temp 97.6°F | Ht 67.0 in

## 2012-06-07 DIAGNOSIS — R110 Nausea: Secondary | ICD-10-CM | POA: Diagnosis not present

## 2012-06-07 DIAGNOSIS — I10 Essential (primary) hypertension: Secondary | ICD-10-CM

## 2012-06-07 DIAGNOSIS — B029 Zoster without complications: Secondary | ICD-10-CM

## 2012-06-07 MED ORDER — ONDANSETRON HCL 4 MG PO TABS: 4.0000 mg | ORAL_TABLET | Freq: Three times a day (TID) | ORAL | Status: DC | PRN

## 2012-06-07 MED ORDER — TRAMADOL HCL 50 MG PO TABS: 50.0000 mg | ORAL_TABLET | Freq: Four times a day (QID) | ORAL | Status: DC | PRN

## 2012-06-07 NOTE — Patient Instructions (Signed)
It was good to see you today. No role for antibiotics or antivirals after the rash has been present for more than 3 days Use Aleve as ongoing and tramadol as needed for unrelieved pain symptoms - Your prescription(s) have been submitted to your pharmacy. Please take as directed and contact our office if you believe you are having problem(s) with the medication(s). Call if pain is worse or unimproved after next 6-12 weeks Shingles Shingles is caused by the same virus that causes chickenpox (varicella zoster virus or VZV). Shingles often occurs many years or decades after having chickenpox. That is why it is more common in adults older than 50 years. The virus reactivates and breaks out as an infection in a nerve root. SYMPTOMS    The initial feeling (sensations) may be pain. This pain is usually described as:   Burning.   Stabbing.   Throbbing.   Tingling in the nerve root.   A red rash will follow in a couple days. The rash may occur in any area of the body and is usually on one side (unilateral) of the body in a band or belt-like pattern. The rash usually starts out as very small blisters (vesicles). They will dry up after 7 to 10 days. This is not usually a significant problem except for the pain it causes.   Long-lasting (chronic) pain is more likely in an elderly person. It can last months to years. This condition is called postherpetic neuralgia.  Shingles can be an extremely severe infection in someone with AIDS, a weakened immune system, or with forms of leukemia. It can also be severe if you are taking transplant medicines or other medicines that weaken the immune system. TREATMENT   Your caregiver will often treat you with:  Antiviral drugs.   Anti-inflammatory drugs.   Pain medicines.  Bed rest is very important in preventing the pain associated with herpes zoster (postherpetic neuralgia). Application of heat in the form of a hot water bottle or electric heating pad or gentle  pressure with the hand is recommended to help with the pain or discomfort. PREVENTION   A varicella zoster vaccine is available to help protect against the virus. The Food and Drug Administration approved the varicella zoster vaccine for individuals 39 years of age and older. HOME CARE INSTRUCTIONS    Cool compresses to the area of rash may be helpful.   Only take over-the-counter or prescription medicines for pain, discomfort, or fever as directed by your caregiver.   Avoid contact with:   Babies.   Pregnant women.   Children with eczema.   Elderly people with transplants.   People with chronic illnesses, such as leukemia and AIDS.   If the area involved is on your face, you may receive a referral for follow-up to a specialist. It is very important to keep all follow-up appointments. This will help avoid eye complications, chronic pain, or disability.  SEEK IMMEDIATE MEDICAL CARE IF:    You develop any pain (headache) in the area of the face or eye. This must be followed carefully by your caregiver or ophthalmologist. An infection in part of your eye (cornea) can be very serious. It could lead to blindness.   You do not have pain relief from prescribed medicines.   Your redness or swelling spreads.   The area involved becomes very swollen and painful.   You have a fever.   You notice any red or painful lines extending away from the affected area toward your  heart (lymphangitis).   Your condition is worsening or has changed.  Document Released: 05/11/2005 Document Revised: 08/03/2011 Document Reviewed: 04/15/2009 [facility] Patient Information 2013 [place], [place].

## 2012-06-07 NOTE — Addendum Note (Signed)
Addended by: Rene Paci A on: 06/07/2012 09:58 AM   Modules accepted: Orders, Level of Service

## 2012-06-07 NOTE — Assessment & Plan Note (Signed)
Well controlled on current medications- continue same No further palpitations (following hosp 04/2010 for same)  BP Readings from Last 3 Encounters:  06/07/12 120/78  04/06/12 120/70  09/10/11 120/68

## 2012-06-07 NOTE — Progress Notes (Addendum)
  Subjective:    Patient ID: Olivia Gay, female    DOB: [birth year], 79 y.o.   MRN: [id number]  HPI  complains of rash L thoracic region Onset ?5 days ago -  associated with pain and burning along bra line on left side only Also nausea but no vomit or diarrhea No fever No history of same  Past Medical History  Diagnosis Date  . Hypertension   . Migraines   . Anemia   . Aortic insufficiency     mild-mod, Echo 12/11and 4/13    Review of Systems  Constitutional: Positive for appetite change (x 5 days) and fatigue. Negative for fever.  Gastrointestinal: Positive for nausea. Negative for vomiting, abdominal pain, diarrhea, constipation and abdominal distention.  Skin: Positive for rash.  Neurological: Negative for dizziness and headaches.       Objective:   Physical Exam BP 120/78  Pulse 80  Temp 97.6 F (36.4 C) (Oral)  Ht 5\' 7"  (1.702 m)  SpO2 95% Wt Readings from Last 3 Encounters:  04/06/12 121 lb 12.8 oz (55.248 kg)  09/10/11 120 lb 12.8 oz (54.795 kg)  04/01/11 117 lb (53.071 kg)   Constitutional: She appears well-developed and well-nourished. No distress. Dtr at side Neck: Normal range of motion. Neck supple. No JVD present. No thyromegaly present.  Cardiovascular: Normal rate, regular rhythm and normal heart sounds.  No murmur heard. No BLE edema. Pulmonary/Chest: Effort normal and breath sounds normal. No respiratory distress. She has no wheezes.  Abd: SNTND+BS Neurological: She is alert and oriented to person, place, and time. No cranial nerve deficit. Coordination normal.  Skin: classic shingles rash with scabbed lesions without vesicles along L dermatomal distribution - no cellulitis  Psychiatric: She has a normal mood and affect. Her behavior is normal. Judgment and thought content normal.   Lab Results  Component Value Date   WBC 6.9 09/24/2010   HGB 13.2 09/24/2010   HCT 38.9 09/24/2010   PLT 230.0 09/24/2010   GLUCOSE 70 09/17/2010   CHOL  Value: 174               05/16/2010   TRIG 83 05/16/2010   HDL 76 05/16/2010   LDLCALC  Value: 81         05/16/2010   ALT 22 05/15/2010   AST 35 05/15/2010   NA 140 09/17/2010   K 4.0 09/17/2010   CL 103 09/17/2010   CREATININE 0.9 09/17/2010   BUN 16 09/17/2010   CO2 30 09/17/2010   TSH 0.626 05/16/2010   HGBA1C  Value: 5.6 (NOTE)                                                             05/16/2010        Assessment & Plan:   Shingles - classic eruption >72h onset, maybe longer based on clinical appearance - no role for antiviral - Pain control - using OTC aleve - add prn tramadol Education provided and reassurance - pt to call if PHN symptoms persist   Nausea alone - no vomiting or diarrhea, afebrile and nontoxic - may be related to viral symptoms? Exam otherwise benign - zofran prn symptomatic relief - pt to call if worse or unimproved

## 2012-06-18 ENCOUNTER — Inpatient Hospital Stay (HOSPITAL_COMMUNITY)
Admission: EM | Admit: 2012-06-18 | Discharge: 2012-06-23 | DRG: 871 | Disposition: A | Discharge status: Skilled Nursing Facility | Payer: Medicare Other | Attending: Internal Medicine | Admitting: Internal Medicine

## 2012-06-18 ENCOUNTER — Encounter (HOSPITAL_COMMUNITY): Payer: Self-pay | Admitting: *Deleted

## 2012-06-18 ENCOUNTER — Emergency Department (HOSPITAL_COMMUNITY): Payer: Medicare Other

## 2012-06-18 DIAGNOSIS — I10 Essential (primary) hypertension: Secondary | ICD-10-CM

## 2012-06-18 DIAGNOSIS — I351 Nonrheumatic aortic (valve) insufficiency: Secondary | ICD-10-CM | POA: Diagnosis present

## 2012-06-18 DIAGNOSIS — E119 Type 2 diabetes mellitus without complications: Secondary | ICD-10-CM | POA: Diagnosis present

## 2012-06-18 DIAGNOSIS — R6520 Severe sepsis without septic shock: Secondary | ICD-10-CM

## 2012-06-18 DIAGNOSIS — Z8612 Personal history of poliomyelitis: Secondary | ICD-10-CM | POA: Diagnosis not present

## 2012-06-18 DIAGNOSIS — R571 Hypovolemic shock: Secondary | ICD-10-CM | POA: Diagnosis present

## 2012-06-18 DIAGNOSIS — E872 Acidosis, unspecified: Secondary | ICD-10-CM | POA: Diagnosis not present

## 2012-06-18 DIAGNOSIS — Z23 Encounter for immunization: Secondary | ICD-10-CM | POA: Diagnosis not present

## 2012-06-18 DIAGNOSIS — A419 Sepsis, unspecified organism: Secondary | ICD-10-CM | POA: Diagnosis not present

## 2012-06-18 DIAGNOSIS — A809 Acute poliomyelitis, unspecified: Secondary | ICD-10-CM

## 2012-06-18 DIAGNOSIS — N179 Acute kidney failure, unspecified: Secondary | ICD-10-CM | POA: Diagnosis not present

## 2012-06-18 DIAGNOSIS — E876 Hypokalemia: Secondary | ICD-10-CM | POA: Diagnosis present

## 2012-06-18 DIAGNOSIS — E869 Volume depletion, unspecified: Secondary | ICD-10-CM | POA: Diagnosis present

## 2012-06-18 DIAGNOSIS — R4182 Altered mental status, unspecified: Secondary | ICD-10-CM | POA: Diagnosis not present

## 2012-06-18 DIAGNOSIS — R578 Other shock: Secondary | ICD-10-CM | POA: Diagnosis not present

## 2012-06-18 DIAGNOSIS — E785 Hyperlipidemia, unspecified: Secondary | ICD-10-CM | POA: Diagnosis present

## 2012-06-18 DIAGNOSIS — B029 Zoster without complications: Secondary | ICD-10-CM | POA: Diagnosis present

## 2012-06-18 DIAGNOSIS — G43909 Migraine, unspecified, not intractable, without status migrainosus: Secondary | ICD-10-CM

## 2012-06-18 DIAGNOSIS — E860 Dehydration: Secondary | ICD-10-CM | POA: Diagnosis not present

## 2012-06-18 DIAGNOSIS — I959 Hypotension, unspecified: Secondary | ICD-10-CM

## 2012-06-18 DIAGNOSIS — I080 Rheumatic disorders of both mitral and aortic valves: Secondary | ICD-10-CM | POA: Diagnosis not present

## 2012-06-18 DIAGNOSIS — Z681 Body mass index (BMI) 19 or less, adult: Secondary | ICD-10-CM | POA: Diagnosis not present

## 2012-06-18 DIAGNOSIS — A0472 Enterocolitis due to Clostridium difficile, not specified as recurrent: Secondary | ICD-10-CM | POA: Diagnosis not present

## 2012-06-18 DIAGNOSIS — R002 Palpitations: Secondary | ICD-10-CM

## 2012-06-18 DIAGNOSIS — R6521 Severe sepsis with septic shock: Secondary | ICD-10-CM

## 2012-06-18 DIAGNOSIS — Z5189 Encounter for other specified aftercare: Secondary | ICD-10-CM | POA: Diagnosis not present

## 2012-06-18 DIAGNOSIS — E440 Moderate protein-calorie malnutrition: Secondary | ICD-10-CM | POA: Diagnosis present

## 2012-06-18 DIAGNOSIS — I359 Nonrheumatic aortic valve disorder, unspecified: Secondary | ICD-10-CM

## 2012-06-18 DIAGNOSIS — D509 Iron deficiency anemia, unspecified: Secondary | ICD-10-CM

## 2012-06-18 DIAGNOSIS — I498 Other specified cardiac arrhythmias: Secondary | ICD-10-CM | POA: Diagnosis present

## 2012-06-18 DIAGNOSIS — G44209 Tension-type headache, unspecified, not intractable: Secondary | ICD-10-CM | POA: Diagnosis not present

## 2012-06-18 DIAGNOSIS — E8729 Other acidosis: Secondary | ICD-10-CM | POA: Diagnosis present

## 2012-06-18 DIAGNOSIS — Z87891 Personal history of nicotine dependence: Secondary | ICD-10-CM

## 2012-06-18 DIAGNOSIS — T794XXA Traumatic shock, initial encounter: Secondary | ICD-10-CM | POA: Diagnosis not present

## 2012-06-18 DIAGNOSIS — A09 Infectious gastroenteritis and colitis, unspecified: Secondary | ICD-10-CM | POA: Diagnosis not present

## 2012-06-18 HISTORY — DX: Acute poliomyelitis, unspecified: A80.9

## 2012-06-18 LAB — POCT I-STAT, CHEM 8
Calcium, Ion: 1.21 mmol/L (ref 1.13–1.30)
Creatinine, Ser: 1.3 mg/dL — ABNORMAL HIGH (ref 0.50–1.10)
Glucose, Bld: 208 mg/dL — ABNORMAL HIGH (ref 70–99)
HCT: 44 % (ref 36.0–46.0)
Hemoglobin: 15 g/dL (ref 12.0–15.0)

## 2012-06-18 LAB — CBC
Hemoglobin: 15.5 g/dL — ABNORMAL HIGH (ref 12.0–15.0)
MCH: 29.5 pg (ref 26.0–34.0)
MCHC: 35.4 g/dL (ref 30.0–36.0)
RBC: 5.25 MIL/uL — ABNORMAL HIGH (ref 3.87–5.11)
RDW: 13.7 % (ref 11.5–15.5)
WBC: 25.2 10*3/uL — ABNORMAL HIGH (ref 4.0–10.5)

## 2012-06-18 LAB — COMPREHENSIVE METABOLIC PANEL
ALT: 13 U/L (ref 0–35)
AST: 22 U/L (ref 0–37)
Alkaline Phosphatase: 81 U/L (ref 39–117)
CO2: 21 mEq/L (ref 19–32)
Chloride: 99 mEq/L (ref 96–112)
GFR calc Af Amer: 42 mL/min — ABNORMAL LOW (ref >90)
GFR calc non Af Amer: 36 mL/min — ABNORMAL LOW (ref >90)
Glucose, Bld: 223 mg/dL — ABNORMAL HIGH (ref 70–99)
Potassium: 3.3 mEq/L — ABNORMAL LOW (ref 3.5–5.1)
Sodium: 144 mEq/L (ref 135–145)
Total Bilirubin: 1.5 mg/dL — ABNORMAL HIGH (ref 0.3–1.2)

## 2012-06-18 LAB — URINALYSIS, ROUTINE W REFLEX MICROSCOPIC
Hgb urine dipstick: NEGATIVE
Ketones, ur: 15 mg/dL — AB
Nitrite: POSITIVE — AB
Urobilinogen, UA: 1 mg/dL (ref 0.0–1.0)

## 2012-06-18 LAB — CREATININE, SERUM
Creatinine, Ser: 0.92 mg/dL (ref 0.50–1.10)
GFR calc non Af Amer: 56 mL/min — ABNORMAL LOW (ref >90)

## 2012-06-18 LAB — URINE MICROSCOPIC-ADD ON

## 2012-06-18 LAB — MRSA PCR SCREENING: MRSA by PCR: NEGATIVE

## 2012-06-18 LAB — CLOSTRIDIUM DIFFICILE BY PCR: Toxigenic C. Difficile by PCR: POSITIVE — AB

## 2012-06-18 MED ORDER — SODIUM CHLORIDE 0.9 % IV BOLUS (SEPSIS)
1000.0000 mL | Freq: Once | INTRAVENOUS | Status: AC
  Administered 2012-06-18: 1000 mL via INTRAVENOUS

## 2012-06-18 MED ORDER — ENOXAPARIN SODIUM 30 MG/0.3ML ~~LOC~~ SOLN
30.0000 mg | SUBCUTANEOUS | Status: DC
  Administered 2012-06-18 – 2012-06-19 (×2): 30 mg via SUBCUTANEOUS
  Filled 2012-06-18 (×2): qty 0.3

## 2012-06-18 MED ORDER — ONDANSETRON HCL 4 MG/2ML IJ SOLN
4.0000 mg | Freq: Once | INTRAMUSCULAR | Status: AC
  Administered 2012-06-18: 4 mg via INTRAVENOUS
  Filled 2012-06-18: qty 2

## 2012-06-18 MED ORDER — SODIUM CHLORIDE 0.9 % IV SOLN
INTRAVENOUS | Status: DC
  Administered 2012-06-18 – 2012-06-19 (×5): via INTRAVENOUS

## 2012-06-18 MED ORDER — LEVOFLOXACIN IN D5W 500 MG/100ML IV SOLN
500.0000 mg | Freq: Once | INTRAVENOUS | Status: AC
  Administered 2012-06-18: 500 mg via INTRAVENOUS
  Filled 2012-06-18: qty 100

## 2012-06-18 MED ORDER — VANCOMYCIN HCL 500 MG IV SOLR: 500.0000 mg | INTRAVENOUS | Status: DC

## 2012-06-18 MED ORDER — VITAMIN C 500 MG PO TABS
500.0000 mg | ORAL_TABLET | Freq: Every day | ORAL | Status: DC
  Administered 2012-06-18 – 2012-06-23 (×6): 500 mg via ORAL
  Filled 2012-06-18 (×6): qty 1

## 2012-06-18 MED ORDER — DEXTROSE 5 % IV SOLN
1.0000 g | Freq: Once | INTRAVENOUS | Status: AC
  Administered 2012-06-18: 1 g via INTRAVENOUS
  Filled 2012-06-18: qty 10

## 2012-06-18 MED ORDER — LEVOFLOXACIN IN D5W 750 MG/150ML IV SOLN: 750.0000 mg | INTRAVENOUS | Status: DC

## 2012-06-18 MED ORDER — VANCOMYCIN 50 MG/ML ORAL SOLUTION
125.0000 mg | Freq: Four times a day (QID) | ORAL | Status: DC
  Administered 2012-06-18 – 2012-06-23 (×20): 125 mg via ORAL
  Filled 2012-06-18 (×27): qty 2.5

## 2012-06-18 MED ORDER — POTASSIUM CHLORIDE 10 MEQ/100ML IV SOLN
10.0000 meq | Freq: Once | INTRAVENOUS | Status: AC
  Administered 2012-06-18: 10 meq via INTRAVENOUS
  Filled 2012-06-18: qty 100

## 2012-06-18 MED ORDER — LEVOFLOXACIN IN D5W 500 MG/100ML IV SOLN: 500.0000 mg | INTRAVENOUS | Status: DC

## 2012-06-18 MED ORDER — PIPERACILLIN-TAZOBACTAM 3.375 G IVPB 30 MIN
3.3750 g | Freq: Three times a day (TID) | INTRAVENOUS | Status: DC
  Administered 2012-06-18: 3.375 g via INTRAVENOUS
  Filled 2012-06-18 (×3): qty 50

## 2012-06-18 MED ORDER — ONDANSETRON HCL 4 MG/2ML IJ SOLN: 4.0000 mg | Freq: Four times a day (QID) | INTRAMUSCULAR | Status: DC | PRN

## 2012-06-18 MED ORDER — VANCOMYCIN HCL 1000 MG IV SOLR: 750.0000 mg | INTRAVENOUS | Status: DC

## 2012-06-18 MED ORDER — METOPROLOL TARTRATE 12.5 MG HALF TABLET: 12.5000 mg | ORAL_TABLET | Freq: Two times a day (BID) | ORAL | Status: DC

## 2012-06-18 MED ORDER — ENOXAPARIN SODIUM 40 MG/0.4ML ~~LOC~~ SOLN: 40.0000 mg | SUBCUTANEOUS | Status: DC

## 2012-06-18 MED ORDER — LEVOFLOXACIN IN D5W 250 MG/50ML IV SOLN: 250.0000 mg | INTRAVENOUS | Status: DC

## 2012-06-18 MED ORDER — OMEGA-3-ACID ETHYL ESTERS 1 G PO CAPS
1.0000 g | ORAL_CAPSULE | Freq: Two times a day (BID) | ORAL | Status: DC
  Administered 2012-06-18 – 2012-06-23 (×10): 1 g via ORAL
  Filled 2012-06-18 (×12): qty 1

## 2012-06-18 MED ORDER — SODIUM CHLORIDE 0.9 % IJ SOLN: 3.0000 mL | Freq: Two times a day (BID) | INTRAMUSCULAR | Status: DC

## 2012-06-18 MED ORDER — ONDANSETRON HCL 4 MG PO TABS: 4.0000 mg | ORAL_TABLET | Freq: Four times a day (QID) | ORAL | Status: DC | PRN

## 2012-06-18 MED ORDER — FAMOTIDINE 10 MG PO TABS
10.0000 mg | ORAL_TABLET | Freq: Every day | ORAL | Status: DC
  Administered 2012-06-18 – 2012-06-23 (×6): 10 mg via ORAL
  Filled 2012-06-18 (×6): qty 1

## 2012-06-18 MED ORDER — METOPROLOL TARTRATE 12.5 MG HALF TABLET
12.5000 mg | ORAL_TABLET | Freq: Two times a day (BID) | ORAL | Status: DC
  Administered 2012-06-18 – 2012-06-20 (×4): 12.5 mg via ORAL
  Filled 2012-06-18 (×6): qty 1

## 2012-06-18 MED ORDER — VANCOMYCIN HCL IN DEXTROSE 1-5 GM/200ML-% IV SOLN
1000.0000 mg | Freq: Once | INTRAVENOUS | Status: AC
  Administered 2012-06-18: 1000 mg via INTRAVENOUS
  Filled 2012-06-18: qty 200

## 2012-06-18 NOTE — Progress Notes (Signed)
ANTIBIOTIC CONSULT NOTE - INITIAL  Pharmacy Consult for Vancomycin Indication: rule out sepsis  No Known Allergies  Patient Measurements: Height: 5' 6.93" (170 cm) Weight: 121 lb 4.1 oz (55 kg) IBW/kg (Calculated) : 61.44    Vital Signs: Temp: 97.8 F (36.6 C) (01/25 0326) Temp src: Oral (01/25 0313) BP: 144/64 mmHg (01/25 0319) Pulse Rate: 86  (01/25 0319)  Labs:  Basename 06/18/12 0112 06/18/12 0057 06/18/12 0055  WBC -- 25.2* --  HGB 15.0 15.5* --  PLT -- 255 --  LABCREA -- -- --  CREATININE 1.30* -- 1.32*   Estimated Creatinine Clearance: 28.5 ml/min (by C-G formula based on Cr of 1.3). No results found for this basename: VANCOTROUGH:2,VANCOPEAK:2,VANCORANDOM:2,GENTTROUGH:2,GENTPEAK:2,GENTRANDOM:2,TOBRATROUGH:2,TOBRAPEAK:2,TOBRARND:2,AMIKACINPEAK:2,AMIKACINTROU:2,AMIKACIN:2, in the last 72 hours   Microbiology: No results found for this or any previous visit (from the past 720 hour(s)).  Medical History: Past Medical History  Diagnosis Date  . Hypertension   . Migraines   . Anemia   . Aortic insufficiency     mild-mod, Echo 12/11and 4/13  . Polio 79 years old    Medications:  Prescriptions prior to admission  Medication Sig Dispense Refill  . amLODipine (NORVASC) 10 MG tablet TAKE 1 TABLET (10 MG TOTAL) BY MOUTH DAILY.  30 tablet  7  . Ascorbic Acid (VITAMIN C) 500 MG tablet Take 500 mg by mouth daily.        . Calcium Carbonate-Vitamin D (CALCIUM + D) 600-200 MG-UNIT TABS Take by mouth daily.        . enalapril (VASOTEC) 20 MG tablet TAKE 1 TABLET EVERY DAY  30 tablet  7  . ferrous sulfate 325 (65 FE) MG tablet Take 325 mg by mouth daily with breakfast.        . metoprolol tartrate (LOPRESSOR) 25 MG tablet Take 12.5 mg by mouth 2 (two) times daily.      . Omega-3 Fatty Acids (FISH OIL) 1000 MG CPDR Take 1,000 mg by mouth daily.        . ondansetron (ZOFRAN) 4 MG tablet Take 1 tablet (4 mg total) by mouth every 8 (eight) hours as needed for nausea.  20  tablet  0  . ranitidine (ZANTAC) 150 MG tablet Take 150 mg by mouth 2 (two) times daily.      . traMADol (ULTRAM) 50 MG tablet Take 1 tablet (50 mg total) by mouth every 6 (six) hours as needed for pain.  40 tablet  0   Assessment: 79 yo female with weakness, possible sepsis for empiric antibiotics  Goal of Therapy:  Vancomycin trough level 15-20 mcg/ml  Plan:  Vancomycin 1 g IV now, then 500 mg IV q24h   Eddie Candle 06/18/2012,5:36 AM

## 2012-06-18 NOTE — Progress Notes (Signed)
[facility] Progress Note [place] TEAM 1 - Stepdown/ICU TEAM   Olivia Gay HYQ:[id number] DOB: 1926-12-13 DOA: 06/18/2012 PCP: Rene Paci, MD  Brief narrative: 79 y.o. female with hx of HTN, AI, migraines, polio affecting her left leg, but generally very independent, still drives and lives alone, presented to the ER feeling weak for the past several days. She had Herpes Zoster about 2 weeks ago, and saw her PCP, given ultram for the pain, but no antiviral Rx. Her pain was better, but she had nausea ever since taking the ultram. She felt very lightheaded and weak and presented to the ER. Evaluation in the ER showed hypotension with SBP 80's and hypothermia. She had elevated lactic acid to 6, and leukocytosis with WBC of 25K. She had an elevated AG to 24, but with bicarb of 21. She has a negative CXR and her UA only showed a few WBC, positive nitrite, and was cloudy. She denied HA, abdominal pain, rash, cough, SOB, sorethroat or myalgia. Her HTN has been controlled but with 3 meds including Norvasc, Analopril, and Lopressor.She was given bear hugger and warm IVF, and she became more normothermic and SBP rose to 144. She was called code sepsis, PCCM deferred admission to hospitalist.   Assessment/Plan:  Community acquired C diff colitis PCR + - vanc po per algorithm - d/c all other abx  Hypotension w/ hx of HTN Quickly resolved w/ volume expansion  Lactic acidosis Due to above - recheck in AM  Equivocal UA Follow cx - stop abx for now in setting of confirmed C diff  Hypokalemia Due to GI losses - replace and follow   Acute renal injury Improving w/ volume   HLD  Aortic insufficiency Followed by Dr. Jens Som at [facility]  Code Status: FULL Family Communication:  Disposition Plan: SDU  Consultants: none  Procedures: none  Antibiotics: Vanc po 1/25 >>  DVT prophylaxis: lovenox  HPI/Subjective: Pt seen for f/u visit   Objective: Blood pressure  135/50, pulse 91, temperature 98.2 F (36.8 C), temperature source Oral, resp. rate 20, height 5\' 7"  (1.702 m), weight 50.6 kg (111 lb 8.8 oz), SpO2 95.00%.  Intake/Output Summary (Last 24 hours) at 06/18/12 1402 Last data filed at 06/18/12 1200  Gross per 24 hour  Intake   1710 ml  Output      0 ml  Net   1710 ml     Exam: F/U exam completed  Data Reviewed: Basic Metabolic Panel:  Lab 06/18/12 9528 06/18/12 0112 06/18/12 0055  NA -- 143 144  K -- 3.1* 3.3*  CL -- 104 99  CO2 -- -- 21  GLUCOSE -- 208* 223*  BUN -- 39* 41*  CREATININE 0.92 1.30* 1.32*  CALCIUM -- -- 10.4  MG -- -- --  PHOS -- -- --   Liver Function Tests:  Lab 06/18/12 0055  AST 22  ALT 13  ALKPHOS 81  BILITOT 1.5*  PROT 6.9  ALBUMIN 3.7   CBC:  Lab 06/18/12 0730 06/18/12 0112 06/18/12 0057  WBC 17.2* -- 25.2*  NEUTROABS -- -- --  HGB 12.6 15.0 15.5*  HCT 35.6* 44.0 42.9  MCV 82.6 -- 81.7  PLT 167 -- 255    Recent Results (from the past 240 hour(s))  MRSA PCR SCREENING     Status: Normal   Collection Time   06/18/12  5:30 AM      Component Value Range Status Comment   MRSA by PCR NEGATIVE  NEGATIVE Final  CLOSTRIDIUM DIFFICILE BY PCR     Status: Abnormal   Collection Time   06/18/12  7:55 AM      Component Value Range Status Comment   C difficile by pcr POSITIVE (*) NEGATIVE Final      Studies:  Recent x-ray studies have been reviewed in detail by the Attending Physician  Scheduled Meds:  Reviewed in detail by the Attending Physician   Lonia Blood, MD [facility] Office  [phone] Pager [phone]  On-Call/Text Page:      [email or web address]      password [email or web address]  If 7PM-7AM, please contact night-coverage [email or web address] Password [email or web address] 06/18/2012, 2:02 PM   LOS: 0 days
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
## 2012-06-18 NOTE — ED Notes (Signed)
DR. Dierdre Highman SPOKE WITH FAMILY ON RESULTS OF TEST AND PLAN OF CARE.

## 2012-06-18 NOTE — ED Notes (Signed)
bearhugger placed by Foye Clock EMT per Dr. Theodoro Kalata

## 2012-06-18 NOTE — ED Notes (Signed)
Case of shingles x 2 weeks; taking tramadol and Zofran. Not taking Zofran, taking zantac.  Family called and not responding; they went over. Pt. Found in bed.  Stated, "shes ok." pale and hypotensive. alert to name, place, surroundings; did not take bp meds today. Very weak. Not been eating.

## 2012-06-18 NOTE — ED Notes (Signed)
Per family pt. Was tx for shingles and nausea x2 weeks- pt nausea has not resolved. Since wed. Pt has been feeling weak. This evening family went to pt. Home d/t not answering phone and found her in bed responsive- but weak and unable to walk. Pt sts she hasnt had anything to eat or drink all day. Has had one episode of vomiting in [place] and 1 episode of diarrhea.

## 2012-06-18 NOTE — ED Notes (Signed)
Pt is warm to touch, more alert, denies nausea sts she feels better and stronger.
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
## 2012-06-19 DIAGNOSIS — A0472 Enterocolitis due to Clostridium difficile, not specified as recurrent: Secondary | ICD-10-CM

## 2012-06-19 DIAGNOSIS — E876 Hypokalemia: Secondary | ICD-10-CM

## 2012-06-19 DIAGNOSIS — I959 Hypotension, unspecified: Secondary | ICD-10-CM

## 2012-06-19 DIAGNOSIS — R571 Hypovolemic shock: Secondary | ICD-10-CM | POA: Diagnosis present

## 2012-06-19 LAB — CBC
MCH: 28.7 pg (ref 26.0–34.0)
MCV: 82.4 fL (ref 78.0–100.0)
Platelets: 142 10*3/uL — ABNORMAL LOW (ref 150–400)
RBC: 3.63 MIL/uL — ABNORMAL LOW (ref 3.87–5.11)

## 2012-06-19 LAB — COMPREHENSIVE METABOLIC PANEL
AST: 15 U/L (ref 0–37)
Albumin: 2.2 g/dL — ABNORMAL LOW (ref 3.5–5.2)
Chloride: 109 mEq/L (ref 96–112)
Creatinine, Ser: 0.86 mg/dL (ref 0.50–1.10)
Potassium: 2.9 mEq/L — ABNORMAL LOW (ref 3.5–5.1)
Total Bilirubin: 0.5 mg/dL (ref 0.3–1.2)

## 2012-06-19 MED ORDER — ENOXAPARIN SODIUM 40 MG/0.4ML ~~LOC~~ SOLN
40.0000 mg | SUBCUTANEOUS | Status: DC
  Administered 2012-06-20 – 2012-06-23 (×4): 40 mg via SUBCUTANEOUS
  Filled 2012-06-19 (×4): qty 0.4

## 2012-06-19 MED ORDER — PNEUMOCOCCAL VAC POLYVALENT 25 MCG/0.5ML IJ INJ
0.5000 mL | INJECTION | INTRAMUSCULAR | Status: AC
  Filled 2012-06-19: qty 0.5

## 2012-06-19 MED ORDER — POTASSIUM CHLORIDE CRYS ER 20 MEQ PO TBCR
40.0000 meq | EXTENDED_RELEASE_TABLET | Freq: Two times a day (BID) | ORAL | Status: AC
  Administered 2012-06-19 – 2012-06-20 (×3): 40 meq via ORAL
  Filled 2012-06-19: qty 2
  Filled 2012-06-19 (×2): qty 1
  Filled 2012-06-19: qty 2

## 2012-06-19 MED ORDER — POTASSIUM CHLORIDE CRYS ER 20 MEQ PO TBCR
40.0000 meq | EXTENDED_RELEASE_TABLET | Freq: Once | ORAL | Status: AC
  Administered 2012-06-19: 40 meq via ORAL
  Filled 2012-06-19: qty 2

## 2012-06-19 NOTE — Progress Notes (Addendum)
[facility] Progress Note [place] TEAM 1 - Stepdown/ICU TEAM   Olivia Gay WUJ:[id number] DOB: Dec 19, 1926 DOA: 06/18/2012 PCP: Rene Paci, MD  Brief narrative: 79 y.o. female with hx of HTN, AI, migraines, polio affecting her left leg, but generally very independent, still drives and lives alone, presented to the ER feeling weak for the past several days. She had Herpes Zoster about 2 weeks ago, and saw her PCP, given ultram for the pain, but no antiviral Rx. Her pain was better, but she had nausea ever since taking the ultram. She felt very lightheaded and weak and presented to the ER. Evaluation in the ER showed hypotension with SBP 80's and hypothermia. She had elevated lactic acid to 6, and leukocytosis with WBC of 25K. She had an elevated AG to 24, but with bicarb of 21. She has a negative CXR and her UA only showed a few WBC, positive nitrite, and was cloudy. She denied HA, abdominal pain, rash, cough, SOB, sorethroat or myalgia. Her HTN has been controlled but with 3 meds including Norvasc, Analopril, and Lopressor.She was given bear hugger and warm IVF, and she became more normothermic and SBP rose to 144. She was called code sepsis, PCCM deferred admission to hospitalist.   Assessment/Plan:  Community acquired C diff colitis PCR + - vanc po per algorithm - d/c all other abx - responding well to tx already  Hypotension w/ hx of HTN Quickly resolved w/ volume expansion  Sinus Bradycardia HR transiently dipped into 30-40 range - pt was asymptomatic w/ preserved SBP - cont to follow on tele - correct electrolyte abnormalities - continue her chronic BB w/o change for now w/ hold parameters  Lactic acidosis Due to above - resolved w/ volume expansion  Equivocal UA Follow cx - stop abx for now in setting of confirmed C diff  Hypokalemia Due to GI losses - replace and follow - check magensium  Acute renal injury Resolved w/ volume expansion  HLD  Aortic  insufficiency Followed by Dr. Jens Som at [facility]  Code Status: FULL Family Communication: no family present  Disposition Plan: transfer to tele bed  Consultants: none  Procedures: none  Antibiotics: Vanc po 1/25 >>  DVT prophylaxis: lovenox  HPI/Subjective: The patient is alert conversant and quite pleasant.  She states that she feels much better today.  She reports that her bowel movements are beginning to firm up some.  She denies chest pain nausea vomiting fevers chills or shortness of breath.   Objective: Blood pressure 111/53, pulse 63, temperature 99 F (37.2 C), temperature source Oral, resp. rate 17, height 5\' 7"  (1.702 m), weight 50.6 kg (111 lb 8.8 oz), SpO2 99.00%.  Intake/Output Summary (Last 24 hours) at 06/19/12 1331 Last data filed at 06/19/12 1100  Gross per 24 hour  Intake   2755 ml  Output      0 ml  Net   2755 ml     Exam: General: No acute respiratory distress at rest Lungs: Clear to auscultation bilaterally without wheezes or crackles Cardiovascular: Regular rate and rhythm without gallop or rub - soft 2/6 systolic murmur Abdomen: Nontender, nondistended, soft, bowel sounds positive, no rebound, no ascites, no appreciable mass Extremities: No significant cyanosis, clubbing, or edema bilateral lower extremities   Data Reviewed: Basic Metabolic Panel:  Lab 06/19/12 9562 06/18/12 0730 06/18/12 0112 06/18/12 0055  NA 141 -- 143 144  K 2.9* -- 3.1* 3.3*  CL 109 -- 104 99  CO2 26 -- -- 21  GLUCOSE 85 -- 208* 223*  BUN 15 -- 39* 41*  CREATININE 0.86 0.92 1.30* 1.32*  CALCIUM 8.4 -- -- 10.4  MG -- -- -- --  PHOS -- -- -- --   Liver Function Tests:  Lab 06/19/12 0500 06/18/12 0055  AST 15 22  ALT 8 13  ALKPHOS 49 81  BILITOT 0.5 1.5*  PROT 4.5* 6.9  ALBUMIN 2.2* 3.7   CBC:  Lab 06/19/12 0500 06/18/12 0730 06/18/12 0112 06/18/12 0057  WBC 8.4 17.2* -- 25.2*  NEUTROABS -- -- -- --  HGB 10.4* 12.6 15.0 15.5*  HCT 29.9* 35.6* 44.0  42.9  MCV 82.4 82.6 -- 81.7  PLT 142* 167 -- 255    Recent Results (from the past 240 hour(s))  URINE CULTURE     Status: Normal (Preliminary result)   Collection Time   06/18/12  2:09 AM      Component Value Range Status Comment   Specimen Description URINE, CLEAN CATCH   Final    Special Requests NONE   Final    Culture  Setup Time 06/18/2012 12:52   Final    Colony Count 55,000 COLONIES/ML   Final    Culture GRAM NEGATIVE RODS   Final    Report Status PENDING   Incomplete   MRSA PCR SCREENING     Status: Normal   Collection Time   06/18/12  5:30 AM      Component Value Range Status Comment   MRSA by PCR NEGATIVE  NEGATIVE Final   CLOSTRIDIUM DIFFICILE BY PCR     Status: Abnormal   Collection Time   06/18/12  7:55 AM      Component Value Range Status Comment   C difficile by pcr POSITIVE (*) NEGATIVE Final      Studies:  Recent x-ray studies have been reviewed in detail by the Attending Physician  Scheduled Meds:  Reviewed in detail by the Attending Physician   Lonia Blood, MD [facility] Office  [phone] Pager [phone]  On-Call/Text Page:      [email or web address]      password [email or web address]  If 7PM-7AM, please contact night-coverage [email or web address] Password [email or web address] 06/19/2012, 1:31 PM   LOS: 1 day

## 2012-06-19 NOTE — Progress Notes (Signed)
Pt transferred to 3W room 31 per MD order. Report called to receiving nurse and all questions answered. Family made aware of transfer.

## 2012-06-19 NOTE — Progress Notes (Signed)
Pt HR dropping into the 30's and 40's this morning. SBP maintaining in 140's-150's and pt is asymptomatic. PA Hardunk made aware. New order for stat ekg given. Ekg showed sinus brady and no changes from previous study. Will continue to monitor.

## 2012-06-20 DIAGNOSIS — I10 Essential (primary) hypertension: Secondary | ICD-10-CM

## 2012-06-20 LAB — BASIC METABOLIC PANEL
BUN: 9 mg/dL (ref 6–23)
CO2: 27 mEq/L (ref 19–32)
Calcium: 8.4 mg/dL (ref 8.4–10.5)
Chloride: 107 mEq/L (ref 96–112)
GFR calc Af Amer: 90 mL/min — ABNORMAL LOW (ref >90)
GFR calc non Af Amer: 77 mL/min — ABNORMAL LOW (ref >90)
GFR calc non Af Amer: 82 mL/min — ABNORMAL LOW (ref >90)
Potassium: 3.6 mEq/L (ref 3.5–5.1)
Potassium: 3.7 mEq/L (ref 3.5–5.1)
Sodium: 132 mEq/L — ABNORMAL LOW (ref 135–145)

## 2012-06-20 LAB — MAGNESIUM: Magnesium: 1.5 mg/dL (ref 1.5–2.5)

## 2012-06-20 LAB — CBC
Hemoglobin: 11.2 g/dL — ABNORMAL LOW (ref 12.0–15.0)
MCHC: 35.4 g/dL (ref 30.0–36.0)
RDW: 13.8 % (ref 11.5–15.5)
WBC: 6.7 10*3/uL (ref 4.0–10.5)

## 2012-06-20 LAB — GLUCOSE, CAPILLARY: Glucose-Capillary: 156 mg/dL — ABNORMAL HIGH (ref 70–99)

## 2012-06-20 MED ORDER — ACETAMINOPHEN 325 MG PO TABS: 650.0000 mg | ORAL_TABLET | Freq: Four times a day (QID) | ORAL | Status: DC | PRN

## 2012-06-20 MED ORDER — ENALAPRIL MALEATE 20 MG PO TABS
20.0000 mg | ORAL_TABLET | Freq: Every day | ORAL | Status: DC
  Administered 2012-06-20 – 2012-06-23 (×4): 20 mg via ORAL
  Filled 2012-06-20 (×4): qty 1

## 2012-06-20 MED ORDER — MAGNESIUM SULFATE 40 MG/ML IJ SOLN
2.0000 g | Freq: Once | INTRAMUSCULAR | Status: AC
  Administered 2012-06-20: 2 g via INTRAVENOUS
  Filled 2012-06-20: qty 50

## 2012-06-20 MED ORDER — AMLODIPINE BESYLATE 10 MG PO TABS
10.0000 mg | ORAL_TABLET | Freq: Every day | ORAL | Status: DC
  Administered 2012-06-20 – 2012-06-23 (×4): 10 mg via ORAL
  Filled 2012-06-20 (×4): qty 1

## 2012-06-20 MED ORDER — DICYCLOMINE HCL 10 MG PO CAPS
10.0000 mg | ORAL_CAPSULE | Freq: Three times a day (TID) | ORAL | Status: DC | PRN
  Administered 2012-06-20: 10 mg via ORAL
  Filled 2012-06-20 (×2): qty 1

## 2012-06-20 NOTE — Evaluation (Addendum)
Physical Therapy Evaluation Patient Details Name: Olivia Gay MRN: [id number] DOB: 03/08/1927 Today's Date: 06/20/2012 Time: 4098-1191 PT Time Calculation (min): 28 min  PT Assessment / Plan / Recommendation Clinical Impression  Pt adm with c-diff and hypotension.  Pt with generalized deconditioning from illness and decr activity.  Initially pt thought she might stay with son but decided ST-SNF would be better option for further therapy before return home alone.    PT Assessment  Patient needs continued PT services    Follow Up Recommendations  SNF    Does the patient have the potential to tolerate intense rehabilitation      Barriers to Discharge        Equipment Recommendations  Rolling walker with 5" wheels    Recommendations for Other Services     Frequency Min 3X/week    Precautions / Restrictions Precautions Precautions: Fall   Pertinent Vitals/Pain No c/o pain.      Mobility  Bed Mobility Supine to Sit: 5: Supervision;With rails;HOB elevated Sitting - Scoot to Edge of Bed: 5: Supervision;With rail Sit to Supine: 5: Supervision;With rail Details for Bed Mobility Assistance: Incr time Transfers Sit to Stand: 4: Min guard;With upper extremity assist;From bed Stand to Sit: 4: Min guard;With upper extremity assist;To chair/3-in-1 Details for Transfer Assistance: Slight assist to raise hips from low surface of toilet Ambulation/Gait Ambulation/Gait Assistance: 4: Min guard Ambulation Distance (Feet): 60 Feet Assistive device: Rolling walker;None Ambulation/Gait Assistance Details: Pt with improved steadiness using rolling walker. Gait Pattern: Step-through pattern;Decreased stride length    Shoulder Instructions     Exercises     PT Diagnosis: Difficulty walking;Generalized weakness  PT Problem List: Decreased strength;Decreased activity tolerance;Decreased balance;Decreased mobility PT Treatment Interventions: DME instruction;Gait training;Functional  mobility training;Patient/family education;Therapeutic activities;Therapeutic exercise;Balance training   PT Goals Acute Rehab PT Goals PT Goal Formulation: With patient Time For Goal Achievement: 06/27/12 Potential to Achieve Goals: Good Pt will go Sit to Stand: with modified independence PT Goal: Sit to Stand - Progress: Goal set today Pt will go Stand to Sit: with modified independence PT Goal: Stand to Sit - Progress: Goal set today Pt will Ambulate: 51 - 150 feet;with modified independence;with least restrictive assistive device PT Goal: Ambulate - Progress: Goal set today  Visit Information  Assistance Needed: +1    Subjective Data      Prior Functioning  Home Living Lives With: Alone Available Help at Discharge: Family;Available PRN/intermittently Type of Home: House Home Access: Level entry Home Layout: One level Bathroom Shower/Tub: Engineer, manufacturing systems: Standard Home Adaptive Equipment: Straight cane Prior Function Level of Independence: Independent Driving: Yes Vocation: Retired Musician: No difficulties Dominant Hand: Right    Cognition  Overall Cognitive Status: Appears within functional limits for tasks assessed/performed Arousal/Alertness: Awake/alert Orientation Level: Appears intact for tasks assessed Behavior During Session: [facility] for tasks performed    Extremity/Trunk Assessment Right Upper Extremity Assessment RUE ROM/Strength/Tone: WFL for tasks assessed RUE Sensation: WFL - Light Touch RUE Coordination: WFL - gross/fine motor Left Upper Extremity Assessment LUE ROM/Strength/Tone: WFL for tasks assessed LUE Sensation: WFL - Light Touch LUE Coordination: WFL - gross/fine motor Left Lower Extremity Assessment LLE ROM/Strength/Tone Deficits: hip/knee grossly 3+/5: ankle 2-/5 Trunk Assessment Trunk Assessment: Normal   Balance Static Standing Balance Static Standing - Balance Support: No upper extremity  supported;During functional activity Static Standing - Level of Assistance: 5: Stand by assistance  End of Session PT - End of Session Equipment Utilized During Treatment: Gait belt Activity  Tolerance: Patient limited by fatigue Patient left: in chair;with call bell/phone within reach Nurse Communication: Mobility status  GP     [facility] 06/20/2012, 1:50 PM  [facility] PT [phone]

## 2012-06-20 NOTE — Clinical Documentation Improvement (Signed)
SEPSIS DOCUMENTATION QUERY  THIS DOCUMENT IS NOT A PERMANENT PART OF THE MEDICAL RECORD  TO RESPOND TO THE THIS QUERY, FOLLOW THE INSTRUCTIONS BELOW:  1. If needed, update documentation for the patient's encounter via the notes activity.  2. Access this query again and click edit on the In [employer].  3. After updating, or not, click F2 to complete all highlighted (required) fields concerning your review. Select "additional documentation in the medical record" OR "no additional documentation provided".  4. Click Sign note button.  5. The deficiency will fall out of your In Basket *Please let us know if you are not able to complete this workflow by phone or e-mail (listed below).  Please update your documentation within the medical record to reflect your response to this query.                                                                                    06/20/12  Dear Dr. Mikle Bosworth [facility],  In a better effort to capture your patient's severity of illness, reflect appropriate length of stay and utilization of resources, a review of the patient medical record has revealed the following indicators.    Based on your clinical judgment, please clarify and document in a progress note and/or discharge summary the clinical condition associated with the following supporting information:  In responding to this query please exercise your independent judgment.  The fact that a query is asked, does not imply that any particular answer is desired or expected.   Noted admitting H + P, "sepsis" and  progress notes "code sepsis" being documented if possible please clarify if the sepsis was and or does continue to be a diagnosis for this hospital visit. Thank you   Possible Clinical Conditions?   Sepsis   Sepsis resolved  SIRS  Sirs resolved   Other Condition  Cannot clinically Determine       Presenting Signs and Symptoms: wbc 17.2 (1/25), hypotension, hypothermia, lactic  acid 6.97 on admit    Cultures: Blood cx results pending , Urine cx 55,000 COLONIES/ML Culture GRAM NEGATIVE RODS    Treatment: Vancomycin IV 750 mg   Changed to po,    Levaquin IV d/cd , Zosyn IV dcd, Rocephin 1 gm IV,                    Bolus NS 1000 mg IV then 50 ml/hr  Reviewed: Early sepsis and Hypovolemic shock due to [facility]. difficile colitis; changes made to progress note. Thanks...  Thank You,  Leonette Most Addison  Clinical Documentation Specialist, BSN:  Pager [phone] off # [phone]  Health Information [facility]

## 2012-06-20 NOTE — Care Management Note (Signed)
    Page 1 of 1   06/20/2012     2:40:12 PM   CARE MANAGEMENT NOTE 06/20/2012  Patient:  ELSIE, BAYNES   Account Number:  [id number]  Date Initiated:  06/20/2012  Documentation initiated by:  GRAVES-BIGELOW,Kyrene Longan  Subjective/Objective Assessment:   Pt admitted due to feeling week. Low bp on admit- Iv fluids given.     Action/Plan:   Per recommendations from PT/OT- plan for SNF. CM did  speak to pt today and she had concerns with [facility] services. CM did relay to CSW that pt requesting [facility].   Anticipated DC Date:  06/22/2012   Anticipated DC Plan:  SKILLED NURSING FACILITY  In-house referral  Clinical Social Worker      DC Planning Services  CM consult      Choice offered to / List presented to:             Status of service:  Completed, signed off Medicare Important Message given?   (If response is "NO", the following Medicare IM given date fields will be blank) Date Medicare IM given:   Date Additional Medicare IM given:    Discharge Disposition:  SKILLED NURSING FACILITY  Per UR Regulation:  Reviewed for med. necessity/level of care/duration of stay  If discussed at Long Length of Stay Meetings, dates discussed:    Comments:

## 2012-06-20 NOTE — Evaluation (Signed)
Occupational Therapy Evaluation Patient Details Name: Olivia Gay MRN: [id number] DOB: Feb 23, 1927 Today's Date: 06/20/2012 Time: 4098-1191 OT Time Calculation (min): 15 min  OT Assessment / Plan / Recommendation Clinical Impression  79 yo female admitted for positive Cdiff and HTN. Pt could benefit from skilled Ot acutley and recommend SNF for d/c planning    OT Assessment  Patient needs continued OT Services    Follow Up Recommendations  SNF ([facility])    Barriers to Discharge      Equipment Recommendations  3 in 1 bedside comode    Recommendations for Other Services    Frequency  Min 2X/week    Precautions / Restrictions Precautions Precautions: Fall   Pertinent Vitals/Pain Lt upward beating nystagmus Educated on gaze stabilization Fall risk    ADL  Eating/Feeding: Set up Where Assessed - Eating/Feeding: Bed level Grooming: Wash/dry hands;Supervision/safety Where Assessed - Grooming: Unsupported standing Lower Body Bathing: Set up Where Assessed - Lower Body Bathing: Supported sitting Toilet Transfer: Minimal assistance Toilet Transfer Method: Sit to Barista: Raised toilet seat with arms (or 3-in-1 over toilet) Toileting - Clothing Manipulation and Hygiene: Min guard Where Assessed - Toileting Clothing Manipulation and Hygiene: Sit to stand from 3-in-1 or toilet Equipment Used: Rolling walker Transfers/Ambulation Related to ADLs: Pt ambulating with Min (A) to use RW in room. Pt picking up RW due to narrowed space. pt educated on side stepping. Pt demonstrates fall risk. Pt with staggered side step once with self correction ADL Comments: Pt noted to have upward left beating nystagmus with bed mobility. Pt states "oh I am dizzy and resolves within ~30 seconds with gaze stabilization." pt could benefit from vestibular eval. Pt is moderate fall risk . Pt unsafe use of RW. Pt requesting SNF now for d/c planning    OT Diagnosis: Generalized  weakness  OT Problem List: Decreased strength;Decreased activity tolerance;Impaired balance (sitting and/or standing);Decreased safety awareness;Decreased knowledge of use of DME or AE;Decreased knowledge of precautions;Pain OT Treatment Interventions: Self-care/ADL training;DME and/or AE instruction;Therapeutic activities;Patient/family education;Balance training   OT Goals Acute Rehab OT Goals OT Goal Formulation: With patient Time For Goal Achievement: 07/04/12 Potential to Achieve Goals: Good ADL Goals Pt Will Perform Upper Body Bathing: with modified independence;Sitting, chair;Supported ADL Goal: Product manager - Progress: Goal set today Pt Will Perform Lower Body Bathing: with set-up;Sitting, chair;Supported ADL Goal: Lower Body Bathing - Progress: Goal set today Pt Will Transfer to Toilet: with set-up;Ambulation;3-in-1 ADL Goal: Toilet Transfer - Progress: Goal set today Miscellaneous OT Goals Miscellaneous OT Goal #1: Pt will complete bed mobility utilizing gaze stabilization to decr nystagmus incr independence to MOD I OT Goal: Miscellaneous Goal #1 - Progress: Goal set today  Visit Information  Last OT Received On: 06/20/12 Assistance Needed: +1    Subjective Data  Subjective: "I think I could go to [facility] before I go back home" Patient Stated Goal: to go to rehab first   Prior Functioning     Home Living Lives With: Alone Available Help at Discharge: Family;Available PRN/intermittently Type of Home: House Home Access: Level entry Home Layout: One level Bathroom Shower/Tub: Engineer, manufacturing systems: Standard Home Adaptive Equipment: Straight cane Prior Function Level of Independence: Independent Driving: Yes Vocation: Retired Musician: No difficulties Dominant Hand: Right         Vision/Perception Vision - Assessment Eye Alignment: Within Functional Limits   Cognition  Overall Cognitive Status: Appears within  functional limits for tasks assessed/performed Arousal/Alertness: Awake/alert Orientation Level: Appears  intact for tasks assessed Behavior During Session: [facility] for tasks performed    Extremity/Trunk Assessment Right Upper Extremity Assessment RUE ROM/Strength/Tone: [facility] for tasks assessed RUE Sensation: WFL - Light Touch RUE Coordination: WFL - gross/fine motor Left Upper Extremity Assessment LUE ROM/Strength/Tone: WFL for tasks assessed LUE Sensation: WFL - Light Touch LUE Coordination: WFL - gross/fine motor Right Lower Extremity Assessment RLE ROM/Strength/Tone: Deficits RLE ROM/Strength/Tone Deficits: grossly 4/5 Left Lower Extremity Assessment LLE ROM/Strength/Tone: Deficits LLE ROM/Strength/Tone Deficits: hip/knee grossly 3+/5: ankle 2-/5 Trunk Assessment Trunk Assessment: Normal     Mobility Bed Mobility Bed Mobility: Supine to Sit;Sitting - Scoot to Edge of Bed Supine to Sit: 5: Supervision;With rails;HOB elevated Sitting - Scoot to Edge of Bed: 5: Supervision;With rail Sit to Supine: 5: Supervision;With rail Details for Bed Mobility Assistance: Incr time Transfers Transfers: Sit to Stand;Stand to Sit Sit to Stand: 4: Min guard;With upper extremity assist;From bed Stand to Sit: 4: Min guard;With upper extremity assist;To chair/3-in-1 Details for Transfer Assistance: Slight assist to raise hips from low surface of toilet     Shoulder Instructions     Exercise     Balance Static Standing Balance Static Standing - Balance Support: No upper extremity supported;During functional activity Static Standing - Level of Assistance: 5: Stand by assistance   End of Session OT - End of Session Activity Tolerance: Patient tolerated treatment well Patient left: in bed;with call bell/phone within reach Nurse Communication: Mobility status;Precautions  GO     Lucile Shutters 06/20/2012, 1:37 PM Pager: [phone]

## 2012-06-20 NOTE — Progress Notes (Signed)
OT NOTE  Pt is requesting [facility] for d/c planning at this time. Pt reports needing SNF for strength and to ensure 24/7 caregiver support. CM- PT and OT recommending SNF.    Harrel Carina [place]   OTR/L Pager: [phone] Office: [phone] .

## 2012-06-20 NOTE — Progress Notes (Signed)
[facility] Progress Note [place] TEAM 1 - Stepdown/ICU TEAM   Olivia Gay ZOX:[id number] DOB: [birth year] DOA: 06/18/2012 PCP: Rene Paci, MD  Brief narrative: 79 y.o. female with hx of HTN, AI, migraines, polio affecting her left leg, but generally very independent, still drives and lives alone, presented to the ER feeling weak for the past several days. She had Herpes Zoster about 2 weeks ago, and saw her PCP, given ultram for the pain, but no antiviral Rx. Her pain was better, but she had nausea ever since taking the ultram. She felt very lightheaded and weak and presented to the ER. Evaluation in the ER showed hypotension with SBP 80's and hypothermia. She had elevated lactic acid to 6, and leukocytosis with WBC of 25K. She had an elevated AG to 24, but with bicarb of 21. She has a negative CXR and her UA only showed a few WBC, positive nitrite, and was cloudy. She denied HA, abdominal pain, rash, cough, SOB, sorethroat or myalgia. Her HTN has been controlled but with 3 meds including Norvasc, Analopril, and Lopressor.She was given bear hugger and warm IVF, and she became more normothermic and SBP rose to 144. She was called code sepsis, PCCM deferred admission to hospitalist.   Since admission, she has tested positive for Cdiff via PCR.  She has responded nicely to volume resuscitation, and is thus far tolerating oral vanc w/o difficulty.  Her diarrhea, however, persists, and she c/o some orthostatic sx.  She has been noted to have episodic bradycardia on tele, but w/o coincident drop in her SBP.  Assessment/Plan:  Community acquired C diff colitis PCR + - vanc po per algorithm - d/c'ed all other abx - responding well to tx in general, but diarrhea very slow to improve - no change in tx today   Hypotension w/ hx of HTN Quickly resolved w/ volume expansion  Sinus Bradycardia HR transiently dipped into 30-40 range - pt was asymptomatic w/ preserved SBP - cont to follow on  tele - correct electrolyte abnormalities - given occasional orthostatic symptoms, will stop BB for now and follow   Lactic acidosis Due to above - resolved w/ volume expansion  Equivocal UA Follow cx - stop abx for now in setting of confirmed C diff  Hypokalemia Due to GI losses - replace and follow - magnesium borderline, so will replete x1 today  Acute renal injury Resolved w/ volume expansion  HLD  Aortic insufficiency Followed by Dr. Jens Som at [facility]  Code Status: FULL Family Communication: no family present  Disposition Plan: tele bed - ultimate plan will be for d/c to SNF for rehab stay   Consultants: none  Procedures: none  Antibiotics: Vanc po 1/25 >>  DVT prophylaxis: lovenox  HPI/Subjective: The patient is conversant and quite pleasant.  She denies chest pain nausea vomiting fevers chills or shortness of breath.  She feels that her diarrhea has returned today, and also reports feeling "a little dizzy headed" when she sits up.     Objective: Blood pressure 168/50, pulse 66, temperature 99 F (37.2 C), temperature source Oral, resp. rate 16, height 5\' 7"  (1.702 m), weight 50.6 kg (111 lb 8.8 oz), SpO2 99.00%.  Intake/Output Summary (Last 24 hours) at 06/20/12 1504 Last data filed at 06/20/12 1455  Gross per 24 hour  Intake    410 ml  Output    602 ml  Net   -192 ml     Exam: General: No acute respiratory distress at rest Lungs:  Clear to auscultation bilaterally without wheezes or crackles Cardiovascular: Regular rate and rhythm without gallop or rub - soft 2/6 systolic murmur Abdomen: Nontender, nondistended, soft, bowel sounds positive, no rebound, no ascites, no appreciable mass Extremities: No significant cyanosis, clubbing, edema bilateral lower extremities   Data Reviewed: Basic Metabolic Panel:  Lab 06/20/12 9604 06/19/12 0500 06/18/12 0730 06/18/12 0112 06/18/12 0055  NA 139 141 -- 143 144  K 3.6 2.9* -- 3.1* 3.3*  CL 107 109 -- 104  99  CO2 26 26 -- -- 21  GLUCOSE 84 85 -- 208* 223*  BUN 9 15 -- 39* 41*  CREATININE 0.72 0.86 0.92 1.30* 1.32*  CALCIUM 8.5 8.4 -- -- 10.4  MG 1.5 -- -- -- --  PHOS -- -- -- -- --   Liver Function Tests:  Lab 06/19/12 0500 06/18/12 0055  AST 15 22  ALT 8 13  ALKPHOS 49 81  BILITOT 0.5 1.5*  PROT 4.5* 6.9  ALBUMIN 2.2* 3.7   CBC:  Lab 06/20/12 0550 06/19/12 0500 06/18/12 0730 06/18/12 0112 06/18/12 0057  WBC 6.7 8.4 17.2* -- 25.2*  NEUTROABS -- -- -- -- --  HGB 11.2* 10.4* 12.6 15.0 15.5*  HCT 31.6* 29.9* 35.6* 44.0 42.9  MCV 81.2 82.4 82.6 -- 81.7  PLT 145* 142* 167 -- 255    Recent Results (from the past 240 hour(s))  URINE CULTURE     Status: Normal (Preliminary result)   Collection Time   06/18/12  2:09 AM      Component Value Range Status Comment   Specimen Description URINE, CLEAN CATCH   Final    Special Requests NONE   Final    Culture  Setup Time 06/18/2012 12:52   Final    Colony Count 55,000 COLONIES/ML   Final    Culture GRAM NEGATIVE RODS   Final    Report Status PENDING   Incomplete   CULTURE, BLOOD (ROUTINE X 2)     Status: Normal (Preliminary result)   Collection Time   06/18/12  4:40 AM      Component Value Range Status Comment   Specimen Description BLOOD RIGHT ARM   Final    Special Requests     Final    Value: BOTTLES DRAWN AEROBIC ONLY 10CC EACH PATIENT ON FOLLOWING ROCEPHIN   Culture  Setup Time 06/18/2012 14:38   Final    Culture     Final    Value:        BLOOD CULTURE RECEIVED NO GROWTH TO DATE CULTURE WILL BE HELD FOR 5 DAYS BEFORE ISSUING A FINAL NEGATIVE REPORT   Report Status PENDING   Incomplete   CULTURE, BLOOD (ROUTINE X 2)     Status: Normal (Preliminary result)   Collection Time   06/18/12  4:50 AM      Component Value Range Status Comment   Specimen Description BLOOD RIGHT HAND   Final    Special Requests     Final    Value: BOTTLES DRAWN AEROBIC ONLY 5CC PATIENT ON FOLLOWING ROCEPHIN   Culture  Setup Time 06/18/2012 14:38    Final    Culture     Final    Value:        BLOOD CULTURE RECEIVED NO GROWTH TO DATE CULTURE WILL BE HELD FOR 5 DAYS BEFORE ISSUING A FINAL NEGATIVE REPORT   Report Status PENDING   Incomplete   MRSA PCR SCREENING     Status: Normal   Collection Time  06/18/12  5:30 AM      Component Value Range Status Comment   MRSA by PCR NEGATIVE  NEGATIVE Final   STOOL CULTURE     Status: Normal (Preliminary result)   Collection Time   06/18/12  7:55 AM      Component Value Range Status Comment   Specimen Description STOOL   Final    Special Requests NONE   Final    Culture Culture reincubated for better growth   Final    Report Status PENDING   Incomplete   CLOSTRIDIUM DIFFICILE BY PCR     Status: Abnormal   Collection Time   06/18/12  7:55 AM      Component Value Range Status Comment   C difficile by pcr POSITIVE (*) NEGATIVE Final      Studies:  Recent x-ray studies have been reviewed in detail by the Attending Physician  Scheduled Meds:  Reviewed in detail by the Attending Physician   Lonia Blood, MD [facility] Office  [phone] Pager [phone]  On-Call/Text Page:      [email or web address]      password [email or web address]  If 7PM-7AM, please contact night-coverage [email or web address] Password [email or web address] 06/20/2012, 3:04 PM   LOS: 2 days

## 2012-06-20 NOTE — Progress Notes (Signed)
UR Completed Braxton Vantrease Graves-Bigelow, RN,BSN [phone]  

## 2012-06-21 DIAGNOSIS — E872 Acidosis: Secondary | ICD-10-CM

## 2012-06-21 LAB — URINE CULTURE

## 2012-06-21 LAB — MAGNESIUM: Magnesium: 1.9 mg/dL (ref 1.5–2.5)

## 2012-06-21 MED ORDER — SODIUM CHLORIDE 0.9 % IV SOLN: INTRAVENOUS | Status: AC

## 2012-06-21 MED ORDER — POTASSIUM CHLORIDE CRYS ER 20 MEQ PO TBCR
40.0000 meq | EXTENDED_RELEASE_TABLET | Freq: Two times a day (BID) | ORAL | Status: AC
  Administered 2012-06-21 (×2): 40 meq via ORAL
  Filled 2012-06-21 (×2): qty 2

## 2012-06-21 MED ORDER — MAGNESIUM OXIDE 400 (241.3 MG) MG PO TABS
400.0000 mg | ORAL_TABLET | Freq: Two times a day (BID) | ORAL | Status: AC
  Administered 2012-06-21 (×2): 400 mg via ORAL
  Filled 2012-06-21 (×2): qty 1

## 2012-06-21 MED ORDER — PNEUMOCOCCAL VAC POLYVALENT 25 MCG/0.5ML IJ INJ
0.5000 mL | INJECTION | INTRAMUSCULAR | Status: DC
  Filled 2012-06-21: qty 0.5

## 2012-06-21 NOTE — Progress Notes (Signed)
BRIEF PSYCHOSOCIAL ASSESSMENT 06/21/2012  Patient:  Olivia Gay, Olivia Gay     Account Number:  [id number]     Admit date:  06/18/2012  Clinical Social Worker:  Robin Searing  Date/Time:  06/21/2012 12:07 PM  Referred by:  Physician  Date Referred:  06/21/2012 Referred for  SNF Placement   Other Referral:   Interview type:  Patient Other interview type:   Message left for son, Brett Canales    PSYCHOSOCIAL DATA Living Status:  ALONE Admitted from facility:   Level of care:   Primary support name:  Brett Canales Primary support relationship to patient:  FAMILY Degree of support available:   good    CURRENT CONCERNS Current Concerns  Post-Acute Placement   Other Concerns:    SOCIAL WORK ASSESSMENT / PLAN Patient admitted from home but feels she may want to consider ST SNF or home with son if needed.    Son is also on board for looking at all options including home with him if needed. He also says they have begun to look at some ALF and retirement community options for her if needed down the road.   Assessment/plan status:  Other - See comment Other assessment/ plan:   FL2 and Pasarr completion   Information/referral to community resources:   SNF  EMS    PATIENT'S/FAMILY'S RESPONSE TO PLAN OF CARE: Patient and son receptive and agreeable to plans for ?ST SNF.     Reece Levy, MSW, Theresia Majors [phone]

## 2012-06-21 NOTE — Progress Notes (Signed)
[facility] PROGRESS NOTE Brief narrative:  79 y.o. female with hx of HTN, AI, migraines, polio affecting her left leg, but generally very independent, still drives and lives alone, presented to the ER feeling weak for the past several days. She had Herpes Zoster about 2 weeks ago, and saw her PCP, given ultram for the pain, but no antiviral Rx. Her pain was better, but she had nausea ever since taking the ultram. She felt very lightheaded and weak and presented to the ER. Evaluation in the ER showed hypotension with SBP 80's and hypothermia. She had elevated lactic acid to 6, and leukocytosis with WBC of 25K. She had an elevated AG to 24, but with bicarb of 21. She has a negative CXR and her UA only showed a few WBC, positive nitrite, and was cloudy. She denied HA, abdominal pain, rash, cough, SOB, sorethroat or myalgia. Her HTN has been controlled but with 3 meds including Norvasc, Analopril, and Lopressor.She was given bear hugger and warm IVF, and she became more normothermic and SBP rose to 144. She was called code sepsis, PCCM deferred admission to hospitalist.  Since admission, she has tested positive for Cdiff via PCR. She has responded nicely to volume resuscitation, and is thus far tolerating oral vanc w/o difficulty. Her diarrhea, however, persists, and she c/o some orthostatic sx. She has been noted to have episodic bradycardia on tele, but w/o coincident drop in her SBP.  Assessment/Plan: Hypovolemic shock due to [facility]. difficile colitis (06/19/2012) - Fluids resuscitative. BP stable - Oral vanc. Started on 1.25.2014. - Still having watery stool about 5 times a day. - Resume IV fluids now hyponatremia and hypochloremic.  Sinus Bradycardia  - HR transiently dipped into 30-40 range - pt was asymptomatic w/ preserved SBP   AKI: -Resolved w/ volume expansion.  Hyponatremia: - 2/2 to volume depletion, resume IV fluids check b-met in am.  Anion gap Lactic acidosis  - Due to  above - resolved w/ volume expansion  Hypokalemia  -Due to GI losses - replace and follow. - replete magnesium  Code Status: FULL  Family Communication: no family present  Disposition Plan: tele bed - ultimate plan will be for d/c to SNF for rehab stay    Consultants:  none  Procedures:  none  Antibiotics:  Vanc PO 1.25.2014  HPI/Subjective: No complains, tolerating her diet. Still having watery diarrhea.  Objective: Filed Vitals:   06/20/12 1801 06/20/12 1802 06/20/12 1805 06/20/12 2100  BP: 171/84 142/70 157/76 127/67  Pulse: 81 95 98 54  Temp:    99 F (37.2 C)  TempSrc:    Oral  Resp:    18  Height:      Weight:      SpO2:    98%    Intake/Output Summary (Last 24 hours) at 06/21/12 0929 Last data filed at 06/21/12 0011  Gross per 24 hour  Intake    360 ml  Output   2003 ml  Net  -1643 ml   Filed Weights   06/18/12 0500 06/18/12 0515  Weight: 55 kg (121 lb 4.1 oz) 50.6 kg (111 lb 8.8 oz)    Exam:  General: Alert, awake, oriented x3, in no acute distress.  HEENT: No bruits, no goiter.  Heart: Regular rate and rhythm, without murmurs, rubs, gallops.  Lungs: Good air movement, bilateral air movement.  Abdomen: Soft, nontender, nondistended, positive bowel sounds.  Neuro: Grossly intact, nonfocal.   Data Reviewed: Basic Metabolic Panel:  Lab 06/21/12 1191 06/20/12  1601 06/20/12 0550 06/19/12 0500 06/18/12 0730 06/18/12 0112 06/18/12 0055  NA -- 132* 139 141 -- 143 144  K -- 3.7 3.6 2.9* -- 3.1* 3.3*  CL -- 98 107 109 -- 104 99  CO2 -- 27 26 26  -- -- 21  GLUCOSE -- 105* 84 85 -- 208* 223*  BUN -- 9 9 15  -- 39* 41*  CREATININE -- 0.61 0.72 0.86 0.92 1.30* --  CALCIUM -- 8.4 8.5 8.4 -- -- 10.4  MG 1.9 -- 1.5 -- -- -- --  PHOS -- -- -- -- -- -- --   Liver Function Tests:  Lab 06/19/12 0500 06/18/12 0055  AST 15 22  ALT 8 13  ALKPHOS 49 81  BILITOT 0.5 1.5*  PROT 4.5* 6.9  ALBUMIN 2.2* 3.7   No results found for this basename:  LIPASE:5,AMYLASE:5 in the last 168 hours No results found for this basename: AMMONIA:5 in the last 168 hours CBC:  Lab 06/20/12 0550 06/19/12 0500 06/18/12 0730 06/18/12 0112 06/18/12 0057  WBC 6.7 8.4 17.2* -- 25.2*  NEUTROABS -- -- -- -- --  HGB 11.2* 10.4* 12.6 15.0 15.5*  HCT 31.6* 29.9* 35.6* 44.0 42.9  MCV 81.2 82.4 82.6 -- 81.7  PLT 145* 142* 167 -- 255   Cardiac Enzymes: No results found for this basename: CKTOTAL:5,CKMB:5,CKMBINDEX:5,TROPONINI:5 in the last 168 hours BNP (last 3 results) No results found for this basename: PROBNP:3 in the last 8760 hours CBG:  Lab 06/18/12 0048  GLUCAP 156*    Recent Results (from the past 240 hour(s))  URINE CULTURE     Status: Normal   Collection Time   06/18/12  2:09 AM      Component Value Range Status Comment   Specimen Description URINE, CLEAN CATCH   Final    Special Requests NONE   Final    Culture  Setup Time 06/18/2012 12:52   Final    Colony Count 55,000 COLONIES/ML   Final    Culture PSEUDOMONAS FLUORESCENS   Final    Report Status 06/21/2012 FINAL   Final    Organism ID, Bacteria PSEUDOMONAS FLUORESCENS   Final   CULTURE, BLOOD (ROUTINE X 2)     Status: Normal (Preliminary result)   Collection Time   06/18/12  4:40 AM      Component Value Range Status Comment   Specimen Description BLOOD RIGHT ARM   Final    Special Requests     Final    Value: BOTTLES DRAWN AEROBIC ONLY 10CC EACH PATIENT ON FOLLOWING ROCEPHIN   Culture  Setup Time 06/18/2012 14:38   Final    Culture     Final    Value:        BLOOD CULTURE RECEIVED NO GROWTH TO DATE CULTURE WILL BE HELD FOR 5 DAYS BEFORE ISSUING A FINAL NEGATIVE REPORT   Report Status PENDING   Incomplete   CULTURE, BLOOD (ROUTINE X 2)     Status: Normal (Preliminary result)   Collection Time   06/18/12  4:50 AM      Component Value Range Status Comment   Specimen Description BLOOD RIGHT HAND   Final    Special Requests     Final    Value: BOTTLES DRAWN AEROBIC ONLY 5CC PATIENT  ON FOLLOWING ROCEPHIN   Culture  Setup Time 06/18/2012 14:38   Final    Culture     Final    Value:        BLOOD CULTURE RECEIVED NO  GROWTH TO DATE CULTURE WILL BE HELD FOR 5 DAYS BEFORE ISSUING A FINAL NEGATIVE REPORT   Report Status PENDING   Incomplete   MRSA PCR SCREENING     Status: Normal   Collection Time   06/18/12  5:30 AM      Component Value Range Status Comment   MRSA by PCR NEGATIVE  NEGATIVE Final   STOOL CULTURE     Status: Normal (Preliminary result)   Collection Time   06/18/12  7:55 AM      Component Value Range Status Comment   Specimen Description STOOL   Final    Special Requests NONE   Final    Culture Culture reincubated for better growth   Final    Report Status PENDING   Incomplete   CLOSTRIDIUM DIFFICILE BY PCR     Status: Abnormal   Collection Time   06/18/12  7:55 AM      Component Value Range Status Comment   C difficile by pcr POSITIVE (*) NEGATIVE Final      Studies: No results found.  Scheduled Meds:    . amLODipine  10 mg Oral Daily  . enalapril  20 mg Oral Daily  . enoxaparin (LOVENOX) injection  40 mg Subcutaneous Q24H  . famotidine  10 mg Oral Daily  . omega-3 acid ethyl esters  1 g Oral BID  . pneumococcal 23 valent vaccine  0.5 mL Intramuscular Tomorrow-1000  . vancomycin  125 mg Oral QID  . vitamin C  500 mg Oral Daily   Continuous Infusions:    . sodium chloride 50 mL/hr at 06/19/12 2327     Marinda Elk  [facility] Pager [phone]. If 8PM-8AM, please contact night-coverage at [email or web address], password [facility] 06/21/2012, 9:29 AM  LOS: 3 days

## 2012-06-21 NOTE — Progress Notes (Signed)
Physical Therapy Treatment Patient Details Name: MALEEYA PETERKIN MRN: [id number] DOB: 05-05-1927 Today's Date: 06/21/2012 Time: 4098-1191 PT Time Calculation (min): 23 min  PT Assessment / Plan / Recommendation Comments on Treatment Session  Gait becoming steadier with distance.  Still in deconditioned state, but improving .    Follow Up Recommendations  SNF     Does the patient have the potential to tolerate intense rehabilitation     Barriers to Discharge        Equipment Recommendations  Rolling walker with 5" wheels    Recommendations for Other Services    Frequency Min 3X/week   Plan Discharge plan remains appropriate;Frequency remains appropriate    Precautions / Restrictions Precautions Precautions: Fall Restrictions Weight Bearing Restrictions: No   Pertinent Vitals/Pain     Mobility  Bed Mobility Bed Mobility: Supine to Sit;Sitting - Scoot to Edge of Bed Supine to Sit: With rails;HOB elevated;6: Modified independent (Device/Increase time) Sitting - Scoot to Edge of Bed: 6: Modified independent (Device/Increase time) Sit to Supine: 6: Modified independent (Device/Increase time);HOB flat Details for Bed Mobility Assistance: Doesn' t scoot up in bed as well as she gets in and out. Transfers Transfers: Sit to Stand;Stand to Sit Sit to Stand: 5: Supervision;With upper extremity assist;From bed (min assist from toilet) Stand to Sit: 5: Supervision;With upper extremity assist;To bed (min assist onto toilet) Details for Transfer Assistance: needed minimal help to control descent to lower toilet and assist stand Ambulation/Gait Ambulation/Gait Assistance: 4: Min guard Ambulation Distance (Feet): 15 Feet (then 100 feet) Assistive device: Rolling walker Ambulation/Gait Assistance Details: generally steady gait that improved with distance.  Initially a bit unsteady. Gait Pattern: Step-through pattern;Decreased stride length Stairs: No    Exercises     PT  Diagnosis:    PT Problem List:   PT Treatment Interventions:     PT Goals Acute Rehab PT Goals Time For Goal Achievement: 06/27/12 Potential to Achieve Goals: Good PT Goal: Sit to Stand - Progress: Progressing toward goal PT Goal: Stand to Sit - Progress: Progressing toward goal PT Goal: Ambulate - Progress: Progressing toward goal  Visit Information  Last PT Received On: 06/21/12 Assistance Needed: +1    Subjective Data  Subjective: I actually feel pretty good, but I still don't have any energy   Cognition  Overall Cognitive Status: Appears within functional limits for tasks assessed/performed Arousal/Alertness: Awake/alert Orientation Level: Appears intact for tasks assessed Behavior During Session: [facility] for tasks performed    Balance  Balance Balance Assessed: Yes Static Standing Balance Static Standing - Level of Assistance:  ( ) Dynamic Standing Balance Dynamic Standing - Balance Support: During functional activity;No upper extremity supported (washing hands at sink--reaching for soap and towels) Dynamic Standing - Level of Assistance: 5: Stand by assistance  End of Session PT - End of Session Activity Tolerance: Patient tolerated treatment well Patient left: in bed;with call bell/phone within reach Nurse Communication: Mobility status   GP     Tramayne Sebesta, Eliseo Gum 06/21/2012, 5:57 PM  06/21/2012  Candelaria Arenas Bing, PT [phone] (pager)

## 2012-06-21 NOTE — Progress Notes (Signed)
Clinical Social Work Department CLINICAL SOCIAL WORK PLACEMENT NOTE 06/21/2012  Patient:  Olivia Gay,Olivia Gay  Account Number:  [id number] Admit date:  06/18/2012  Clinical Social Worker:  Rochester Serpe, LCSWA  Date/time:  06/21/2012 02:52 PM  Clinical Social Work is seeking post-discharge placement for this patient at the following level of care:   SKILLED NURSING   (*CSW will update this form in Epic as items are completed)   06/21/2012  Patient/family provided with [facility] Department of Clinical Social Work's list of facilities offering this level of care within the geographic area requested by the patient (or if unable, by the patient's family).  06/21/2012  Patient/family informed of their freedom to choose among providers that offer the needed level of care, that participate in Medicare, Medicaid or managed care program needed by the patient, have an available bed and are willing to accept the patient.  06/21/2012  Patient/family informed of [facility]' ownership interest in [facility], as well as of the fact that they are under no obligation to receive care at this facility.  PASARR submitted to [employer] on 06/21/2012 PASARR number received from [employer] on   FL2 transmitted to all facilities in geographic area requested by pt/family on  06/21/2012 FL2 transmitted to all facilities within larger geographic area on   Patient informed that his/her managed care company has contracts with or will negotiate with  certain facilities, including the following:     Patient/family informed of bed offers received:   Patient chooses bed at  Physician recommends and patient chooses bed at    Patient to be transferred to  on   Patient to be transferred to facility by   The following physician request were entered in Epic:   Additional Comments:  Eydan Chianese, MSW, LCSWA [phone]  

## 2012-06-22 DIAGNOSIS — E869 Volume depletion, unspecified: Secondary | ICD-10-CM

## 2012-06-22 DIAGNOSIS — R578 Other shock: Secondary | ICD-10-CM

## 2012-06-22 LAB — BASIC METABOLIC PANEL
Calcium: 8.9 mg/dL (ref 8.4–10.5)
GFR calc non Af Amer: 78 mL/min — ABNORMAL LOW (ref >90)
Potassium: 4.4 mEq/L (ref 3.5–5.1)
Sodium: 139 mEq/L (ref 135–145)

## 2012-06-22 LAB — MAGNESIUM: Magnesium: 1.8 mg/dL (ref 1.5–2.5)

## 2012-06-22 MED ORDER — SACCHAROMYCES BOULARDII 250 MG PO CAPS
250.0000 mg | ORAL_CAPSULE | Freq: Two times a day (BID) | ORAL | Status: DC
  Administered 2012-06-22 – 2012-06-23 (×3): 250 mg via ORAL
  Filled 2012-06-22 (×4): qty 1

## 2012-06-22 MED ORDER — PNEUMOCOCCAL VAC POLYVALENT 25 MCG/0.5ML IJ INJ
0.5000 mL | INJECTION | INTRAMUSCULAR | Status: DC
  Filled 2012-06-22: qty 0.5

## 2012-06-22 NOTE — Progress Notes (Addendum)
[facility] PROGRESS NOTE  Assessment/Plan: Early sepsis and Hypovolemic shock due to [facility]. difficile colitis  -BP stable after IVF's given -Oral vanc. Started on 1.25.2014. -Still having watery stool about 4-5 times a day. -Will encourage PO intake -start florastor -Follow BMET and replete electrolytes as needed  Sinus Bradycardia  -HR transiently dipped into 30-40 range - pt was asymptomatic w/ preserved SBP  -no further episodes. Will monitor on telemetry -if needed will adjust B-blocker   AKI: -Resolved w/ volume expansion. -Will monitor Renal function  Hyponatremia: - 2/2 to volume depletion -Encourage to increase oral intake to guarantee hydration. -IVF's changed to NSL  Anion gap Lactic acidosis  - Due to GI loses - resolved w/ volume expansion -will monitor  Hypokalemia  -Due to GI losses - replace and follow. - magnesium repleted. -BMET in am  Code Status: FULL  Family Communication: no family present  Disposition Plan: tele bed - ultimate plan will be for d/c to SNF for rehab stay when medically stable (probably 1-2 days)   Consultants:  none  Procedures:  none  Antibiotics:  Vanc PO 1.25.2014 (plan is for 14 days)  HPI/Subjective: Afebrile; Still having watery diarrhea (more than 4 episodes per day). Tolerating diet and also ABX's by mouth  Objective: Filed Vitals:   06/20/12 1805 06/20/12 2100 06/21/12 1344 06/22/12 0500  BP: 157/76 127/67 137/67 151/66  Pulse: 98 54 103 93  Temp:  99 F (37.2 C) 98.8 F (37.1 C) 98.1 F (36.7 C)  TempSrc:  Oral Oral   Resp:  18 20 16   Height:   5' 6.5" (1.689 m)   Weight:      SpO2:  98% 100% 98%    Intake/Output Summary (Last 24 hours) at 06/22/12 0930 Last data filed at 06/21/12 1700  Gross per 24 hour  Intake    240 ml  Output    200 ml  Net     40 ml   Filed Weights   06/18/12 0500 06/18/12 0515  Weight: 55 kg (121 lb 4.1 oz) 50.6 kg (111 lb 8.8 oz)    Exam:  General:  Alert, awake, oriented x3, in no acute distress. Afebrile HEENT: No bruits, no goiter.  Heart: Regular rate and rhythm, without murmurs, rubs, gallops.  Lungs: Good air movement, bilateral air movement.  Abdomen: Soft, nontender, nondistended, positive bowel sounds.  Neuro: Grossly intact, nonfocal.   Data Reviewed: Basic Metabolic Panel:  Lab 06/22/12 1308 06/21/12 0545 06/20/12 1601 06/20/12 0550 06/19/12 0500 06/18/12 0730 06/18/12 0112 06/18/12 0055  NA 139 -- 132* 139 141 -- 143 --  K 4.4 -- 3.7 3.6 2.9* -- 3.1* --  CL 104 -- 98 107 109 -- 104 --  CO2 27 -- 27 26 26  -- -- 21  GLUCOSE 90 -- 105* 84 85 -- 208* --  BUN 5* -- 9 9 15  -- 39* --  CREATININE 0.71 -- 0.61 0.72 0.86 0.92 -- --  CALCIUM 8.9 -- 8.4 8.5 8.4 -- -- 10.4  MG 1.8 1.9 -- 1.5 -- -- -- --  PHOS -- -- -- -- -- -- -- --   Liver Function Tests:  Lab 06/19/12 0500 06/18/12 0055  AST 15 22  ALT 8 13  ALKPHOS 49 81  BILITOT 0.5 1.5*  PROT 4.5* 6.9  ALBUMIN 2.2* 3.7   CBC:  Lab 06/20/12 0550 06/19/12 0500 06/18/12 0730 06/18/12 0112 06/18/12 0057  WBC 6.7 8.4 17.2* -- 25.2*  NEUTROABS -- -- -- -- --  HGB 11.2* 10.4* 12.6 15.0 15.5*  HCT 31.6* 29.9* 35.6* 44.0 42.9  MCV 81.2 82.4 82.6 -- 81.7  PLT 145* 142* 167 -- 255   CBG:  Lab 06/18/12 0048  GLUCAP 156*    Recent Results (from the past 240 hour(s))  URINE CULTURE     Status: Normal   Collection Time   06/18/12  2:09 AM      Component Value Range Status Comment   Specimen Description URINE, CLEAN CATCH   Final    Special Requests NONE   Final    Culture  Setup Time 06/18/2012 12:52   Final    Colony Count 55,000 COLONIES/ML   Final    Culture PSEUDOMONAS FLUORESCENS   Final    Report Status 06/21/2012 FINAL   Final    Organism ID, Bacteria PSEUDOMONAS FLUORESCENS   Final   CULTURE, BLOOD (ROUTINE X 2)     Status: Normal (Preliminary result)   Collection Time   06/18/12  4:40 AM      Component Value Range Status Comment   Specimen Description  BLOOD RIGHT ARM   Final    Special Requests     Final    Value: BOTTLES DRAWN AEROBIC ONLY 10CC EACH PATIENT ON FOLLOWING ROCEPHIN   Culture  Setup Time 06/18/2012 14:38   Final    Culture     Final    Value:        BLOOD CULTURE RECEIVED NO GROWTH TO DATE CULTURE WILL BE HELD FOR 5 DAYS BEFORE ISSUING A FINAL NEGATIVE REPORT   Report Status PENDING   Incomplete   CULTURE, BLOOD (ROUTINE X 2)     Status: Normal (Preliminary result)   Collection Time   06/18/12  4:50 AM      Component Value Range Status Comment   Specimen Description BLOOD RIGHT HAND   Final    Special Requests     Final    Value: BOTTLES DRAWN AEROBIC ONLY 5CC PATIENT ON FOLLOWING ROCEPHIN   Culture  Setup Time 06/18/2012 14:38   Final    Culture     Final    Value:        BLOOD CULTURE RECEIVED NO GROWTH TO DATE CULTURE WILL BE HELD FOR 5 DAYS BEFORE ISSUING A FINAL NEGATIVE REPORT   Report Status PENDING   Incomplete   MRSA PCR SCREENING     Status: Normal   Collection Time   06/18/12  5:30 AM      Component Value Range Status Comment   MRSA by PCR NEGATIVE  NEGATIVE Final   STOOL CULTURE     Status: Normal (Preliminary result)   Collection Time   06/18/12  7:55 AM      Component Value Range Status Comment   Specimen Description STOOL   Final    Special Requests NONE   Final    Culture NO SUSPICIOUS COLONIES, CONTINUING TO HOLD   Final    Report Status PENDING   Incomplete   CLOSTRIDIUM DIFFICILE BY PCR     Status: Abnormal   Collection Time   06/18/12  7:55 AM      Component Value Range Status Comment   C difficile by pcr POSITIVE (*) NEGATIVE Final      Studies: No results found.  Scheduled Meds:    . amLODipine  10 mg Oral Daily  . enalapril  20 mg Oral Daily  . enoxaparin (LOVENOX) injection  40 mg Subcutaneous Q24H  . famotidine  10 mg Oral Daily  . omega-3 acid ethyl esters  1 g Oral BID  . pneumococcal 23 valent vaccine  0.5 mL Intramuscular Tomorrow-1000  . saccharomyces boulardii  250 mg Oral  BID  . vancomycin  125 mg Oral QID  . vitamin C  500 mg Oral Daily   Continuous Infusions:     Vesna Kable  [facility] Pager [phone]. If 8PM-8AM, please contact night-coverage at [email or web address], password [email or web address] 06/22/2012, 9:30 AM  LOS: 4 days

## 2012-06-22 NOTE — Progress Notes (Signed)
Updated patient's son of current SNF bed offers- he will review the list and I will plan to f/u tomorrow for further planning-  Noted MD's note indicating ?d/c in 1-2 days. Reece Levy, MSW, Theresia Majors [phone]

## 2012-06-23 DIAGNOSIS — B029 Zoster without complications: Secondary | ICD-10-CM | POA: Diagnosis not present

## 2012-06-23 DIAGNOSIS — T794XXA Traumatic shock, initial encounter: Secondary | ICD-10-CM | POA: Diagnosis not present

## 2012-06-23 DIAGNOSIS — I10 Essential (primary) hypertension: Secondary | ICD-10-CM | POA: Diagnosis not present

## 2012-06-23 DIAGNOSIS — E869 Volume depletion, unspecified: Secondary | ICD-10-CM | POA: Diagnosis not present

## 2012-06-23 DIAGNOSIS — A0472 Enterocolitis due to Clostridium difficile, not specified as recurrent: Secondary | ICD-10-CM | POA: Diagnosis not present

## 2012-06-23 DIAGNOSIS — Z87891 Personal history of nicotine dependence: Secondary | ICD-10-CM | POA: Diagnosis not present

## 2012-06-23 DIAGNOSIS — A419 Sepsis, unspecified organism: Secondary | ICD-10-CM | POA: Diagnosis not present

## 2012-06-23 DIAGNOSIS — E872 Acidosis: Secondary | ICD-10-CM | POA: Diagnosis not present

## 2012-06-23 DIAGNOSIS — I080 Rheumatic disorders of both mitral and aortic valves: Secondary | ICD-10-CM | POA: Diagnosis not present

## 2012-06-23 DIAGNOSIS — A09 Infectious gastroenteritis and colitis, unspecified: Secondary | ICD-10-CM | POA: Diagnosis not present

## 2012-06-23 DIAGNOSIS — Z5189 Encounter for other specified aftercare: Secondary | ICD-10-CM | POA: Diagnosis not present

## 2012-06-23 DIAGNOSIS — G44209 Tension-type headache, unspecified, not intractable: Secondary | ICD-10-CM | POA: Diagnosis not present

## 2012-06-23 LAB — STOOL CULTURE

## 2012-06-23 LAB — BASIC METABOLIC PANEL
Calcium: 9.5 mg/dL (ref 8.4–10.5)
GFR calc Af Amer: >90 mL/min (ref >90)
GFR calc non Af Amer: 79 mL/min — ABNORMAL LOW (ref >90)
Sodium: 137 mEq/L (ref 135–145)

## 2012-06-23 MED ORDER — TRAMADOL HCL 50 MG PO TABS: 50.0000 mg | ORAL_TABLET | Freq: Four times a day (QID) | ORAL | Status: DC | PRN

## 2012-06-23 MED ORDER — SACCHAROMYCES BOULARDII 250 MG PO CAPS: 250.0000 mg | ORAL_CAPSULE | Freq: Two times a day (BID) | ORAL | Status: DC

## 2012-06-23 MED ORDER — BOOST / RESOURCE BREEZE PO LIQD
1.0000 | Freq: Three times a day (TID) | ORAL | Status: DC
  Administered 2012-06-23: 1 via ORAL

## 2012-06-23 MED ORDER — VANCOMYCIN 50 MG/ML ORAL SOLUTION: 125.0000 mg | Freq: Four times a day (QID) | ORAL | Status: DC

## 2012-06-23 MED ORDER — BOOST / RESOURCE BREEZE PO LIQD: 1.0000 | Freq: Three times a day (TID) | ORAL | Status: DC

## 2012-06-23 NOTE — Progress Notes (Signed)
Attempted to call report to [facility], was placed on hold.  Unable to give report.  Packet to be sent with family and patient to facility.

## 2012-06-23 NOTE — Progress Notes (Signed)
Physical Therapy Treatment Patient Details Name: Olivia Gay MRN: [id number] DOB: 11/13/1926 Today's Date: 06/23/2012 Time: 4098-1191 PT Time Calculation (min): 21 min  PT Assessment / Plan / Recommendation Comments on Treatment Session  Generally more steady today.  Has some difficulty directing the RW straight. Expect steady improvement and home soon.    Follow Up Recommendations  SNF     Does the patient have the potential to tolerate intense rehabilitation     Barriers to Discharge        Equipment Recommendations       Recommendations for Other Services    Frequency Min 3X/week   Plan Discharge plan remains appropriate;Frequency remains appropriate    Precautions / Restrictions Precautions Precautions: Fall Restrictions Weight Bearing Restrictions: No   Pertinent Vitals/Pain     Mobility  Bed Mobility Bed Mobility: Supine to Sit;Sitting - Scoot to Edge of Bed Supine to Sit: 6: Modified independent (Device/Increase time);With rails Sitting - Scoot to Edge of Bed: 6: Modified independent (Device/Increase time) Sit to Supine: 5: Supervision Transfers Transfers: Sit to Stand;Stand to Sit Sit to Stand: 5: Supervision;With upper extremity assist;From bed;From toilet Stand to Sit: 5: Supervision;To bed;To toilet Details for Transfer Assistance: safe mobility Ambulation/Gait Ambulation/Gait Assistance: 4: Min guard Ambulation Distance (Feet): 190 Feet Assistive device: Rolling walker Ambulation/Gait Assistance Details: steady with RW but still wanders Gait Pattern: Step-through pattern;Decreased stride length    Exercises     PT Diagnosis:    PT Problem List:   PT Treatment Interventions:     PT Goals Acute Rehab PT Goals Time For Goal Achievement: 06/27/12 Potential to Achieve Goals: Good PT Goal: Sit to Stand - Progress: Progressing toward goal PT Goal: Stand to Sit - Progress: Progressing toward goal PT Goal: Ambulate - Progress: Progressing toward  goal  Visit Information  Last PT Received On: 06/23/12 Assistance Needed: +1    Subjective Data      Cognition  Overall Cognitive Status: Appears within functional limits for tasks assessed/performed Arousal/Alertness: Awake/alert Orientation Level: Appears intact for tasks assessed Behavior During Session: [facility] for tasks performed    Balance  Static Standing Balance Static Standing - Balance Support: No upper extremity supported Static Standing - Level of Assistance: 5: Stand by assistance  End of Session PT - End of Session Activity Tolerance: Patient tolerated treatment well Patient left: in bed;with call bell/phone within reach Nurse Communication: Mobility status   GP     Juergen Hardenbrook, Eliseo Gum 06/23/2012, 4:58 PM 06/23/2012  Huntley Bing, PT [phone] (pager)

## 2012-06-23 NOTE — Progress Notes (Signed)
Occupational Therapy Treatment Patient Details Name: Olivia Gay MRN: [id number] DOB: 1926/10/25 Today's Date: 06/23/2012 Time: 8657-8469 OT Time Calculation (min): 12 min  OT Assessment / Plan / Recommendation Comments on Treatment Session Pt progressing and continues to have decr activity tolerance. Pt terminating task prematurely due to fatigue    Follow Up Recommendations  SNF    Barriers to Discharge       Equipment Recommendations  3 in 1 bedside comode    Recommendations for Other Services    Frequency Min 2X/week   Plan Discharge plan remains appropriate    Precautions / Restrictions Precautions Precautions: Fall   Pertinent Vitals/Pain Fatigued finishing PT and static standing at sink level    ADL  Grooming: Wash/dry hands;Brushing hair;Min guard Where Assessed - Grooming: Unsupported standing Equipment Used: Rolling walker Transfers/Ambulation Related to ADLs: Pt completed basic transfer supervision level. min v.c for safety with RW ADL Comments: Pt finishing with PT Ken ambulating to room. Pt requires Min v/c for safety wtih RW. Pt attempting to abandon RW. Pt leaving RW to right side x2 during session. Pt static standing to comb hair. Pt terminating task prematurely due to fatigue.    OT Diagnosis:    OT Problem List:   OT Treatment Interventions:     OT Goals Acute Rehab OT Goals OT Goal Formulation: With patient Time For Goal Achievement: 07/04/12 Potential to Achieve Goals: Good ADL Goals Pt Will Perform Upper Body Bathing: with modified independence;Sitting, chair;Supported ADL Goal: Product manager - Progress: Progressing toward goals Pt Will Perform Lower Body Bathing: with set-up;Sitting, chair;Supported Engineer, water to Toilet: with set-up;Ambulation;3-in-1 ADL Goal: Toilet Transfer - Progress: Progressing toward goals Miscellaneous OT Goals Miscellaneous OT Goal #1: Pt will complete bed mobility utilizing gaze stabilization to decr  nystagmus incr independence to MOD I OT Goal: Miscellaneous Goal #1 - Progress: Progressing toward goals  Visit Information  Last OT Received On: 06/23/12 Assistance Needed: +1    Subjective Data      Prior Functioning       Cognition  Overall Cognitive Status: Appears within functional limits for tasks assessed/performed Arousal/Alertness: Awake/alert Orientation Level: Appears intact for tasks assessed Behavior During Session: [facility] for tasks performed    Mobility  Shoulder Instructions Bed Mobility Bed Mobility: Sit to Supine Sit to Supine: 5: Supervision Transfers Stand to Sit: 5: Supervision;To bed Details for Transfer Assistance: safety with RW min (A)       Exercises      Balance Static Standing Balance Static Standing - Balance Support: No upper extremity supported Static Standing - Level of Assistance: 5: Stand by assistance   End of Session OT - End of Session Activity Tolerance: Patient tolerated treatment well Patient left: in bed;with call bell/phone within reach Nurse Communication: Mobility status;Precautions  GO     Lucile Shutters 06/23/2012, 3:27 PM Pager: [phone]
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
## 2012-06-23 NOTE — Progress Notes (Signed)
Family had decided upon SNF bed at [facility] but this bed has since been rescinded due to the CDIFF that they had overlooked- they only have a semi private room available and cannot place her with this other patient. Message left for family and await callback for bed selection- will advise Reece Levy, MSW, [employer] [phone]

## 2012-06-23 NOTE — Discharge Summary (Signed)
Physician Discharge Summary  Olivia Gay:[id number] DOB: Feb 03, 1927 DOA: 06/18/2012  PCP: Rene Paci, MD  Admit date: 06/18/2012 Discharge date: 06/23/2012  Time spent: >30 minutes  Recommendations for Outpatient Follow-up:  BMET in 7-10 days to follow electrolytes and kidney function. Please reevaluate BP and readjust medications as needed.  Discharge Diagnoses:  Principal Problem:  *C. difficile colitis Active Problems:  TOBACCO USE, QUIT  Aortic insufficiency  High anion gap metabolic acidosis  Volume depletion  Herpes zoster  Hypokalemia  Hypovolemic shock   Discharge Condition: stable and improved; no CP, no SOB and afebrile. At this moment having 1-2 loose BM's, but tolerating food and keeping things down properly. Patient electrolytes are stable and WNL at discharge. VSS; will discharge to SNF, [facility] for physical rehabilitation and conditioning.  Diet recommendation: heart healthy  Filed Weights   06/18/12 0500 06/18/12 0515  Weight: 55 kg (121 lb 4.1 oz) 50.6 kg (111 lb 8.8 oz)    History of present illness:  79 y.o. woman with hx of HTN, AI, migraines, polio affecting her left leg, but generally very independent, still drives and lives alone, presents to the ER feeling weak for the past several days. She had Herpes Zoster about 2 weeks ago, and saw her PCP, given ultram for the pain, but no antiviral Rx. Her pain is better, but she has nausea ever since taking the ultram. Tonight, she felt very lightheaded and weak and presents to the ER. Evaluation in the ER showed hypotension with SBP 80's and hypothermia. She has elevated lactic acid to 6, and leukocytosis with WBC of 25K. She has an elevated AG to 24, but with bicarb of 21. She has a negative CXR and her UA only showed a few WBC, positive nitrite, and was cloudy. She denied HA, abdominal pain, rash, cough, SOB, sorethroat or myalgia. Her HTN has been controlled but with 3 meds including Norvasc,  Analopril, and Lopressor.She was given bear hugger and warm IVF, and she became more normothermic and SBP rose to 144. She was called code sepsis, PCCM deferred admission to hospitalist.   Hospital Course:  Early sepsis and Hypovolemic shock due to [facility]. difficile colitis  -BP stable at discharge. -Oral vanc. Started on 1.25.2014; plan is for 14 days total. -just with loose BM's, only 1-2 episodes now.  -encourage PO intake and continue feeding supplements -Continue florastor   Sinus Bradycardia  -HR transiently dipped into 30-40 range - pt was asymptomatic w/ preserved SBP  -no further episodes.  -will discharge on home medications and follow up with PCP for further adjustments, most likely infection and also electrolytes abnormalities.  AKI:  -Resolved w/ volume expansion.  -Will monitor Renal function as an outpatient.  Hyponatremia:  - 2/2 to volume depletion  -Encourage to increase oral intake to guarantee hydration.   Anion gap Lactic acidosis  -Due to GI loses - resolved w/ volume expansion  -will monitor   Hypokalemia  -Due to GI losses - replace and follow.  - magnesium repleted.  -BMET in am   Moderate protein calorie malnutrition -Feeding supplements -increased/encourage po intake  Consultations:  PT/OT  Discharge Exam: Filed Vitals:   06/22/12 1429 06/22/12 1937 06/23/12 0428 06/23/12 0922  BP: 137/62 160/74 157/64 140/66  Pulse: 103 125 99   Temp: 98.7 F (37.1 C) 98.3 F (36.8 C) 98.8 F (37.1 C)   TempSrc:      Resp: 18 18 16    Height:      Weight:  SpO2: 98% 98% 98%    General: Alert, awake, oriented x3, in no acute distress. Afebrile  HEENT: No bruits, no goiter.  Heart: Regular rate and rhythm, without murmurs, rubs, gallops.  Lungs: Good air movement, bilateral air movement.  Abdomen: Soft, nontender, nondistended, positive bowel sounds.  Neuro: Grossly intact, nonfocal.  Discharge Instructions  Discharge Orders    Future  Appointments: Provider: Department: Dept Phone: Center:   04/05/2013 2:30 PM Newt Lukes, MD [facility] [phone] [facility]     Future Orders Please Complete By Expires   Diet - low sodium heart healthy      Discharge instructions      Comments:   -Keep patient well hydrated -Take medications as prescribed -Follow up BMET in 7 days.       Medication List     As of 06/23/2012 12:24 PM    TAKE these medications         amLODipine 10 MG tablet   Commonly known as: NORVASC   TAKE 1 TABLET (10 MG TOTAL) BY MOUTH DAILY.      Calcium + D 600-200 MG-UNIT Tabs   Generic drug: Calcium Carbonate-Vitamin D   Take by mouth daily.      enalapril 20 MG tablet   Commonly known as: VASOTEC   TAKE 1 TABLET EVERY DAY      feeding supplement Liqd   Take 1 Container by mouth 3 (three) times daily between meals.      ferrous sulfate 325 (65 FE) MG tablet   Take 325 mg by mouth daily with breakfast.      Fish Oil 1000 MG Cpdr   Take 1,000 mg by mouth daily.      metoprolol tartrate 25 MG tablet   Commonly known as: LOPRESSOR   Take 12.5 mg by mouth 2 (two) times daily.      ondansetron 4 MG tablet   Commonly known as: ZOFRAN   Take 1 tablet (4 mg total) by mouth every 8 (eight) hours as needed for nausea.      ranitidine 150 MG tablet   Commonly known as: ZANTAC   Take 150 mg by mouth 2 (two) times daily.      saccharomyces boulardii 250 MG capsule   Commonly known as: FLORASTOR   Take 1 capsule (250 mg total) by mouth 2 (two) times daily.      traMADol 50 MG tablet   Commonly known as: ULTRAM   Take 1 tablet (50 mg total) by mouth every 6 (six) hours as needed for pain.      vancomycin 50 mg/mL oral solution   Commonly known as: VANCOCIN   Take 2.5 mLs (125 mg total) by mouth 4 (four) times daily. For a total of 10 days      vitamin C 500 MG tablet   Commonly known as: ASCORBIC ACID   Take 500 mg by mouth daily.           Follow-up  Information    Follow up with Rene Paci, MD. Schedule an appointment as soon as possible for a visit in 2 weeks.   Contact information:   [address] [place] [address] [phone]           The results of significant diagnostics from this hospitalization (including imaging, microbiology, ancillary and laboratory) are listed below for reference.    Significant Diagnostic Studies: Dg Chest Portable 1 View  06/18/2012  *  RADIOLOGY REPORT*  Clinical Data: Altered mental status, hypotension, dehydration, weakness and vomiting.  PORTABLE CHEST - 1 VIEW  Comparison: Chest radiograph performed 05/15/2010, and CTA of the chest performed 05/16/2010  Findings: The lungs are well-aerated and clear.  There is no evidence of focal opacification, pleural effusion or pneumothorax. Pectus excavatum is again noted, with haziness at the right midlung zone.  The cardiomediastinal silhouette is normal in size; calcification is noted within the aortic arch.  No acute osseous abnormalities are seen.  IMPRESSION: No acute cardiopulmonary process seen.   Original Report Authenticated By: Tonia Ghent, M.D.     Microbiology: Recent Results (from the past 240 hour(s))  URINE CULTURE     Status: Normal   Collection Time   06/18/12  2:09 AM      Component Value Range Status Comment   Specimen Description URINE, CLEAN CATCH   Final    Special Requests NONE   Final    Culture  Setup Time 06/18/2012 12:52   Final    Colony Count 55,000 COLONIES/ML   Final    Culture PSEUDOMONAS FLUORESCENS   Final    Report Status 06/21/2012 FINAL   Final    Organism ID, Bacteria PSEUDOMONAS FLUORESCENS   Final   CULTURE, BLOOD (ROUTINE X 2)     Status: Normal (Preliminary result)   Collection Time   06/18/12  4:40 AM      Component Value Range Status Comment   Specimen Description BLOOD RIGHT ARM   Final    Special Requests     Final    Value: BOTTLES DRAWN AEROBIC ONLY 10CC EACH  PATIENT ON FOLLOWING ROCEPHIN   Culture  Setup Time 06/18/2012 14:38   Final    Culture     Final    Value:        BLOOD CULTURE RECEIVED NO GROWTH TO DATE CULTURE WILL BE HELD FOR 5 DAYS BEFORE ISSUING A FINAL NEGATIVE REPORT   Report Status PENDING   Incomplete   CULTURE, BLOOD (ROUTINE X 2)     Status: Normal (Preliminary result)   Collection Time   06/18/12  4:50 AM      Component Value Range Status Comment   Specimen Description BLOOD RIGHT HAND   Final    Special Requests     Final    Value: BOTTLES DRAWN AEROBIC ONLY 5CC PATIENT ON FOLLOWING ROCEPHIN   Culture  Setup Time 06/18/2012 14:38   Final    Culture     Final    Value:        BLOOD CULTURE RECEIVED NO GROWTH TO DATE CULTURE WILL BE HELD FOR 5 DAYS BEFORE ISSUING A FINAL NEGATIVE REPORT   Report Status PENDING   Incomplete   MRSA PCR SCREENING     Status: Normal   Collection Time   06/18/12  5:30 AM      Component Value Range Status Comment   MRSA by PCR NEGATIVE  NEGATIVE Final   STOOL CULTURE     Status: Normal   Collection Time   06/18/12  7:55 AM      Component Value Range Status Comment   Specimen Description STOOL   Final    Special Requests NONE   Final    Culture     Final    Value: NO SALMONELLA, SHIGELLA, CAMPYLOBACTER, YERSINIA, OR E.COLI 0157:H7 ISOLATED   Report Status 06/23/2012 FINAL   Final   CLOSTRIDIUM DIFFICILE BY PCR     Status: Abnormal  Collection Time   06/18/12  7:55 AM      Component Value Range Status Comment   C difficile by pcr POSITIVE (*) NEGATIVE Final      Labs: Basic Metabolic Panel:  Lab 06/23/12 1610 06/22/12 0620 06/21/12 0545 06/20/12 1601 06/20/12 0550 06/19/12 0500  NA 137 139 -- 132* 139 141  K 4.1 4.4 -- 3.7 3.6 2.9*  CL 100 104 -- 98 107 109  CO2 28 27 -- 27 26 26   GLUCOSE 94 90 -- 105* 84 85  BUN 6 5* -- 9 9 15   CREATININE 0.66 0.71 -- 0.61 0.72 0.86  CALCIUM 9.5 8.9 -- 8.4 8.5 8.4  MG -- 1.8 1.9 -- 1.5 --  PHOS -- -- -- -- -- --   Liver Function Tests:  Lab  06/19/12 0500 06/18/12 0055  AST 15 22  ALT 8 13  ALKPHOS 49 81  BILITOT 0.5 1.5*  PROT 4.5* 6.9  ALBUMIN 2.2* 3.7   CBC:  Lab 06/20/12 0550 06/19/12 0500 06/18/12 0730 06/18/12 0112 06/18/12 0057  WBC 6.7 8.4 17.2* -- 25.2*  NEUTROABS -- -- -- -- --  HGB 11.2* 10.4* 12.6 15.0 15.5*  HCT 31.6* 29.9* 35.6* 44.0 42.9  MCV 81.2 82.4 82.6 -- 81.7  PLT 145* 142* 167 -- 255   CBG:  Lab 06/18/12 0048  GLUCAP 156*      Signed:  Marvel Mcphillips  [facility] 06/23/2012, 12:24 PM

## 2012-06-24 LAB — CULTURE, BLOOD (ROUTINE X 2): Culture: NO GROWTH

## 2012-06-24 NOTE — Progress Notes (Signed)
Clinical Social Work Department CLINICAL SOCIAL WORK PLACEMENT NOTE 06/24/2012  Patient:  Olivia Gay, Olivia Gay  Account Number:  [id number] Admit date:  06/18/2012  Clinical Social Worker:  Robin Searing  Date/time:  06/21/2012 02:52 PM  Clinical Social Work is seeking post-discharge placement for this patient at the following level of care:   SKILLED NURSING   (*CSW will update this form in Epic as items are completed)   06/21/2012  Patient/family provided with [facility] Department of Clinical Social Work's list of facilities offering this level of care within the geographic area requested by the patient (or if unable, by the patient's family).  06/21/2012  Patient/family informed of their freedom to choose among providers that offer the needed level of care, that participate in Medicare, Medicaid or managed care program needed by the patient, have an available bed and are willing to accept the patient.  06/21/2012  Patient/family informed of [facility]' ownership interest in [facility], as well as of the fact that they are under no obligation to receive care at this facility.  PASARR submitted to [employer] on 06/21/2012 PASARR number received from [employer] on 06/22/2012  FL2 transmitted to all facilities in geographic area requested by pt/family on  06/21/2012 FL2 transmitted to all facilities within larger geographic area on   Patient informed that his/her managed care company has contracts with or will negotiate with  certain facilities, including the following:     Patient/family informed of bed offers received:  06/22/2012 Patient chooses bed at [facility] [place] Physician recommends and patient chooses bed at    Patient to be transferred to [facility], [place] on  06/23/2012 Patient to be transferred to facility by [facility]  The following physician request were entered in Epic:   Additional Comments:  Reece Levy, MSW,  Theresia Majors [phone]

## 2012-06-29 DIAGNOSIS — A0472 Enterocolitis due to Clostridium difficile, not specified as recurrent: Secondary | ICD-10-CM | POA: Diagnosis not present

## 2012-07-04 ENCOUNTER — Telehealth: Payer: Self-pay | Admitting: Internal Medicine

## 2012-07-04 NOTE — Telephone Encounter (Signed)
Family has questions.  All tests have not been followed.  May need c-diff test.  Discuss what long term care will be.  Pt has an appt tues morning. Arline Asp will be bringing her.

## 2012-07-05 ENCOUNTER — Ambulatory Visit: Payer: Medicare Other | Admitting: Internal Medicine

## 2012-07-23 DIAGNOSIS — Z5189 Encounter for other specified aftercare: Secondary | ICD-10-CM | POA: Diagnosis not present

## 2012-07-23 DIAGNOSIS — A0472 Enterocolitis due to Clostridium difficile, not specified as recurrent: Secondary | ICD-10-CM | POA: Diagnosis not present

## 2012-07-23 DIAGNOSIS — A09 Infectious gastroenteritis and colitis, unspecified: Secondary | ICD-10-CM | POA: Diagnosis not present

## 2012-07-23 DIAGNOSIS — G44209 Tension-type headache, unspecified, not intractable: Secondary | ICD-10-CM | POA: Diagnosis not present

## 2012-07-23 DIAGNOSIS — Z87891 Personal history of nicotine dependence: Secondary | ICD-10-CM | POA: Diagnosis not present

## 2012-07-23 DIAGNOSIS — T794XXA Traumatic shock, initial encounter: Secondary | ICD-10-CM | POA: Diagnosis not present

## 2012-07-23 DIAGNOSIS — B029 Zoster without complications: Secondary | ICD-10-CM | POA: Diagnosis not present

## 2012-07-23 DIAGNOSIS — I10 Essential (primary) hypertension: Secondary | ICD-10-CM | POA: Diagnosis not present

## 2012-07-23 DIAGNOSIS — I080 Rheumatic disorders of both mitral and aortic valves: Secondary | ICD-10-CM | POA: Diagnosis not present

## 2012-07-23 DIAGNOSIS — E869 Volume depletion, unspecified: Secondary | ICD-10-CM | POA: Diagnosis not present

## 2012-08-07 DIAGNOSIS — R262 Difficulty in walking, not elsewhere classified: Secondary | ICD-10-CM | POA: Diagnosis not present

## 2012-08-07 DIAGNOSIS — B029 Zoster without complications: Secondary | ICD-10-CM | POA: Diagnosis not present

## 2012-08-07 DIAGNOSIS — M6281 Muscle weakness (generalized): Secondary | ICD-10-CM | POA: Diagnosis not present

## 2012-08-07 DIAGNOSIS — I10 Essential (primary) hypertension: Secondary | ICD-10-CM | POA: Diagnosis not present

## 2012-08-09 ENCOUNTER — Telehealth: Payer: Self-pay | Admitting: *Deleted

## 2012-08-09 ENCOUNTER — Other Ambulatory Visit: Payer: Self-pay | Admitting: Cardiology

## 2012-08-09 DIAGNOSIS — R262 Difficulty in walking, not elsewhere classified: Secondary | ICD-10-CM | POA: Diagnosis not present

## 2012-08-09 DIAGNOSIS — M6281 Muscle weakness (generalized): Secondary | ICD-10-CM | POA: Diagnosis not present

## 2012-08-09 DIAGNOSIS — I10 Essential (primary) hypertension: Secondary | ICD-10-CM | POA: Diagnosis not present

## 2012-08-09 DIAGNOSIS — B029 Zoster without complications: Secondary | ICD-10-CM | POA: Diagnosis not present

## 2012-08-09 DIAGNOSIS — R269 Unspecified abnormalities of gait and mobility: Secondary | ICD-10-CM

## 2012-08-09 NOTE — Telephone Encounter (Signed)
Left msg on vm stating pt was d/c from hosp & was referred to them. Requesting verbal order to continue seeing pt 2 x's a week for 5 weeks for gait training, balance, safety & fall prevention. Pt is also inquiring about a Rollater if ok pls fax rx to [phone] Att: Lesli Albee....Raechel Chute

## 2012-08-10 NOTE — Telephone Encounter (Signed)
For all of the above requests, please give my verbal okay

## 2012-08-10 NOTE — Telephone Encounter (Signed)
Called Jae Dire not answer LMOM md response. Also fax rx for rollator to cindy...Raechel Chute

## 2012-08-12 DIAGNOSIS — B029 Zoster without complications: Secondary | ICD-10-CM | POA: Diagnosis not present

## 2012-08-12 DIAGNOSIS — R262 Difficulty in walking, not elsewhere classified: Secondary | ICD-10-CM | POA: Diagnosis not present

## 2012-08-12 DIAGNOSIS — I10 Essential (primary) hypertension: Secondary | ICD-10-CM | POA: Diagnosis not present

## 2012-08-12 DIAGNOSIS — M6281 Muscle weakness (generalized): Secondary | ICD-10-CM | POA: Diagnosis not present

## 2012-08-15 DIAGNOSIS — R262 Difficulty in walking, not elsewhere classified: Secondary | ICD-10-CM | POA: Diagnosis not present

## 2012-08-15 DIAGNOSIS — B029 Zoster without complications: Secondary | ICD-10-CM | POA: Diagnosis not present

## 2012-08-15 DIAGNOSIS — I10 Essential (primary) hypertension: Secondary | ICD-10-CM | POA: Diagnosis not present

## 2012-08-15 DIAGNOSIS — M6281 Muscle weakness (generalized): Secondary | ICD-10-CM | POA: Diagnosis not present

## 2012-08-17 DIAGNOSIS — I10 Essential (primary) hypertension: Secondary | ICD-10-CM | POA: Diagnosis not present

## 2012-08-17 DIAGNOSIS — R262 Difficulty in walking, not elsewhere classified: Secondary | ICD-10-CM | POA: Diagnosis not present

## 2012-08-17 DIAGNOSIS — M6281 Muscle weakness (generalized): Secondary | ICD-10-CM | POA: Diagnosis not present

## 2012-08-17 DIAGNOSIS — B029 Zoster without complications: Secondary | ICD-10-CM | POA: Diagnosis not present

## 2012-08-18 DIAGNOSIS — I10 Essential (primary) hypertension: Secondary | ICD-10-CM | POA: Diagnosis not present

## 2012-08-18 DIAGNOSIS — R262 Difficulty in walking, not elsewhere classified: Secondary | ICD-10-CM | POA: Diagnosis not present

## 2012-08-18 DIAGNOSIS — M6281 Muscle weakness (generalized): Secondary | ICD-10-CM | POA: Diagnosis not present

## 2012-08-18 DIAGNOSIS — B029 Zoster without complications: Secondary | ICD-10-CM | POA: Diagnosis not present

## 2012-08-22 DIAGNOSIS — M6281 Muscle weakness (generalized): Secondary | ICD-10-CM | POA: Diagnosis not present

## 2012-08-22 DIAGNOSIS — R262 Difficulty in walking, not elsewhere classified: Secondary | ICD-10-CM | POA: Diagnosis not present

## 2012-08-22 DIAGNOSIS — I10 Essential (primary) hypertension: Secondary | ICD-10-CM | POA: Diagnosis not present

## 2012-08-22 DIAGNOSIS — B029 Zoster without complications: Secondary | ICD-10-CM | POA: Diagnosis not present

## 2012-08-24 DIAGNOSIS — M6281 Muscle weakness (generalized): Secondary | ICD-10-CM | POA: Diagnosis not present

## 2012-08-24 DIAGNOSIS — R262 Difficulty in walking, not elsewhere classified: Secondary | ICD-10-CM | POA: Diagnosis not present

## 2012-08-24 DIAGNOSIS — I10 Essential (primary) hypertension: Secondary | ICD-10-CM | POA: Diagnosis not present

## 2012-08-24 DIAGNOSIS — B029 Zoster without complications: Secondary | ICD-10-CM | POA: Diagnosis not present

## 2012-08-25 DIAGNOSIS — B029 Zoster without complications: Secondary | ICD-10-CM

## 2012-08-25 DIAGNOSIS — M6281 Muscle weakness (generalized): Secondary | ICD-10-CM | POA: Diagnosis not present

## 2012-08-25 DIAGNOSIS — I10 Essential (primary) hypertension: Secondary | ICD-10-CM | POA: Diagnosis not present

## 2012-08-25 DIAGNOSIS — R262 Difficulty in walking, not elsewhere classified: Secondary | ICD-10-CM

## 2012-08-26 DIAGNOSIS — R262 Difficulty in walking, not elsewhere classified: Secondary | ICD-10-CM | POA: Diagnosis not present

## 2012-08-26 DIAGNOSIS — B029 Zoster without complications: Secondary | ICD-10-CM | POA: Diagnosis not present

## 2012-08-26 DIAGNOSIS — M6281 Muscle weakness (generalized): Secondary | ICD-10-CM | POA: Diagnosis not present

## 2012-08-26 DIAGNOSIS — I10 Essential (primary) hypertension: Secondary | ICD-10-CM | POA: Diagnosis not present

## 2012-08-30 DIAGNOSIS — R279 Unspecified lack of coordination: Secondary | ICD-10-CM | POA: Diagnosis not present

## 2012-08-30 DIAGNOSIS — M6281 Muscle weakness (generalized): Secondary | ICD-10-CM | POA: Diagnosis not present

## 2012-08-30 DIAGNOSIS — R269 Unspecified abnormalities of gait and mobility: Secondary | ICD-10-CM | POA: Diagnosis not present

## 2012-09-06 DIAGNOSIS — R279 Unspecified lack of coordination: Secondary | ICD-10-CM | POA: Diagnosis not present

## 2012-09-06 DIAGNOSIS — R269 Unspecified abnormalities of gait and mobility: Secondary | ICD-10-CM | POA: Diagnosis not present

## 2012-09-06 DIAGNOSIS — M6281 Muscle weakness (generalized): Secondary | ICD-10-CM | POA: Diagnosis not present

## 2012-09-09 DIAGNOSIS — R269 Unspecified abnormalities of gait and mobility: Secondary | ICD-10-CM | POA: Diagnosis not present

## 2012-09-09 DIAGNOSIS — R279 Unspecified lack of coordination: Secondary | ICD-10-CM | POA: Diagnosis not present

## 2012-09-09 DIAGNOSIS — M6281 Muscle weakness (generalized): Secondary | ICD-10-CM | POA: Diagnosis not present

## 2012-09-12 ENCOUNTER — Telehealth: Payer: Self-pay | Admitting: Cardiology

## 2012-09-12 DIAGNOSIS — R269 Unspecified abnormalities of gait and mobility: Secondary | ICD-10-CM | POA: Diagnosis not present

## 2012-09-12 DIAGNOSIS — M6281 Muscle weakness (generalized): Secondary | ICD-10-CM | POA: Diagnosis not present

## 2012-09-12 DIAGNOSIS — R279 Unspecified lack of coordination: Secondary | ICD-10-CM | POA: Diagnosis not present

## 2012-09-12 NOTE — Telephone Encounter (Signed)
New problem    phy therapist wants to discuss pts b/p

## 2012-09-12 NOTE — Telephone Encounter (Signed)
Spoke with patient's physical therapist, Lynnell Catalan who called to report to Dr. Jens Som that patient c/o dizziness after therapy.  She took patient's BP which was 118/62 sitting and then 94/65 standing.  She states she had a difficult time obtaining a pressure while patient was standing.  Olivia Gay had patient sit back down and drink 2 glasses of water.  After doing so she walked the patient and the patient did not complain of dizziness.  She will have her next therapy session on Wed. 4/23.

## 2012-09-12 NOTE — Telephone Encounter (Signed)
Change amlodipine to 5 mg po daily Olga Millers

## 2012-09-13 NOTE — Telephone Encounter (Signed)
Left message for pt to call.

## 2012-09-14 DIAGNOSIS — R269 Unspecified abnormalities of gait and mobility: Secondary | ICD-10-CM | POA: Diagnosis not present

## 2012-09-14 DIAGNOSIS — R279 Unspecified lack of coordination: Secondary | ICD-10-CM | POA: Diagnosis not present

## 2012-09-14 DIAGNOSIS — M6281 Muscle weakness (generalized): Secondary | ICD-10-CM | POA: Diagnosis not present

## 2012-09-14 MED ORDER — AMLODIPINE BESYLATE 5 MG PO TABS: 5.0000 mg | ORAL_TABLET | Freq: Every day | ORAL | Status: DC

## 2012-09-14 NOTE — Telephone Encounter (Signed)
Spoke with pt, Aware of dr crenshaw's recommendations.  °

## 2012-09-14 NOTE — Telephone Encounter (Signed)
Follow up     Patient  Call back with correct phone number   [phone] .

## 2012-09-16 DIAGNOSIS — R269 Unspecified abnormalities of gait and mobility: Secondary | ICD-10-CM | POA: Diagnosis not present

## 2012-09-16 DIAGNOSIS — M6281 Muscle weakness (generalized): Secondary | ICD-10-CM | POA: Diagnosis not present

## 2012-09-16 DIAGNOSIS — R279 Unspecified lack of coordination: Secondary | ICD-10-CM | POA: Diagnosis not present

## 2012-09-20 ENCOUNTER — Telehealth: Payer: Self-pay | Admitting: Cardiology

## 2012-09-20 DIAGNOSIS — R269 Unspecified abnormalities of gait and mobility: Secondary | ICD-10-CM | POA: Diagnosis not present

## 2012-09-20 DIAGNOSIS — R279 Unspecified lack of coordination: Secondary | ICD-10-CM | POA: Diagnosis not present

## 2012-09-20 DIAGNOSIS — M6281 Muscle weakness (generalized): Secondary | ICD-10-CM | POA: Diagnosis not present

## 2012-09-20 NOTE — Telephone Encounter (Addendum)
Spoke with Olivia Gay, the pt cont to have low bp after decreasing the amlodipine to 5 mg daily. She does not have related symptoms. Left message for pt to call

## 2012-09-20 NOTE — Telephone Encounter (Signed)
New Prob    Pt BP is still dropping when going from sit to stand 90/72 (stand), 122/68 (sit). After giving pt some water 128/70 (sit) 102/70 (standing) pt is not dizzy or feeling light headed. Would like to speak to nurse.

## 2012-09-23 DIAGNOSIS — R269 Unspecified abnormalities of gait and mobility: Secondary | ICD-10-CM | POA: Diagnosis not present

## 2012-09-23 DIAGNOSIS — R279 Unspecified lack of coordination: Secondary | ICD-10-CM | POA: Diagnosis not present

## 2012-09-23 DIAGNOSIS — M6281 Muscle weakness (generalized): Secondary | ICD-10-CM | POA: Diagnosis not present

## 2012-10-04 NOTE — Telephone Encounter (Signed)
Left message for pt to call.

## 2012-10-10 ENCOUNTER — Telehealth: Payer: Self-pay | Admitting: Cardiology

## 2012-10-10 NOTE — Telephone Encounter (Signed)
**Note De-Identified Lorance Pickeral Obfuscation** Lazarus Salines, PT at [employer] [facility], wants Dr Jens Som to be aware of pts BP of 118/82 while sitting and 98/82 while standing. Pt has appt with Dr. Jens Som tomorrow (5/20) at 2:15.

## 2012-10-10 NOTE — Telephone Encounter (Signed)
New Prob     BP today sitting was 118/82 and standing was 98/82 (very hard to hear) does not complain of light headedness but seems unsteady, heart rate 80 bpm. Wanted to notify office.

## 2012-10-11 ENCOUNTER — Encounter: Payer: Self-pay | Admitting: Cardiology

## 2012-10-11 ENCOUNTER — Other Ambulatory Visit: Payer: Self-pay | Admitting: Internal Medicine

## 2012-10-11 ENCOUNTER — Other Ambulatory Visit: Payer: Self-pay | Admitting: Cardiology

## 2012-10-11 ENCOUNTER — Ambulatory Visit (INDEPENDENT_AMBULATORY_CARE_PROVIDER_SITE_OTHER): Payer: Medicare Other | Admitting: Cardiology

## 2012-10-11 VITALS — BP 98/60 | HR 82 | Ht 66.0 in | Wt 107.0 lb

## 2012-10-11 DIAGNOSIS — I10 Essential (primary) hypertension: Secondary | ICD-10-CM

## 2012-10-11 DIAGNOSIS — I359 Nonrheumatic aortic valve disorder, unspecified: Secondary | ICD-10-CM | POA: Diagnosis not present

## 2012-10-11 MED ORDER — AMLODIPINE BESYLATE 5 MG PO TABS: 5.0000 mg | ORAL_TABLET | Freq: Every day | ORAL | Status: DC

## 2012-10-11 MED ORDER — TRAMADOL HCL 50 MG PO TABS: 50.0000 mg | ORAL_TABLET | Freq: Four times a day (QID) | ORAL | Status: DC | PRN

## 2012-10-11 NOTE — Patient Instructions (Addendum)
Your physician wants you to follow-up in: ONE YEAR WITH DR Shelda Pal will receive a reminder letter in the mail two months in advance. If you don't receive a letter, please call our office to schedule the follow-up appointment.   STOP ENALAPRIL  STOP METOPROLOL

## 2012-10-11 NOTE — Assessment & Plan Note (Signed)
Blood pressure is running low. Discontinue metoprolol and enalapril. Follow blood pressure on Norvasc alone and adjust as needed.
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
## 2012-10-11 NOTE — Assessment & Plan Note (Signed)
She has continued pain recent shingles. We will refill her tramadol.

## 2012-10-11 NOTE — Assessment & Plan Note (Signed)
Patient was mild aortic stenosis and aortic insufficiency on most recent echo. Repeat echocardiogram when she returns in one year.

## 2012-10-12 ENCOUNTER — Telehealth: Payer: Self-pay | Admitting: Cardiology

## 2012-10-12 MED ORDER — METOPROLOL TARTRATE 25 MG PO TABS: 25.0000 mg | ORAL_TABLET | Freq: Two times a day (BID) | ORAL | Status: DC

## 2012-10-12 NOTE — Telephone Encounter (Signed)
New problem    Pts b/p 120/72 but heartrate at rest is 114-120 per min

## 2012-10-12 NOTE — Telephone Encounter (Signed)
Resume metoprolol and DC norvasc; follow BP Olga Millers

## 2012-10-12 NOTE — Telephone Encounter (Signed)
Follow Up     Pts heart rate at rest is 114-120 bpm, concerned and would like to speak to nurse regarding this.

## 2012-10-12 NOTE — Telephone Encounter (Addendum)
Spoke with Olivia Gay, we stopped the metoprolol yesterday. Her bp is good but her heart rate was 120 at rest. The pt is feeling fine. Unsteady on her feet but otherwise fine. Will forward for dr Jens Som review

## 2012-10-12 NOTE — Telephone Encounter (Signed)
Spoke with pt and jennifer, Aware of dr Ludwig Clarks recommendations.

## 2012-10-14 ENCOUNTER — Other Ambulatory Visit: Payer: Self-pay | Admitting: Internal Medicine

## 2012-10-14 ENCOUNTER — Telehealth: Payer: Self-pay | Admitting: Cardiology

## 2012-10-14 NOTE — Telephone Encounter (Signed)
Left message on pharm voicemail, they will need to contact the PCP, we do not fill these meds for this pt.

## 2012-10-14 NOTE — Telephone Encounter (Signed)
New Problem:   Patient called in needing her traMADol (ULTRAM) 50 MG tablet and mirtazapine (REMERON) 15 MG tablet sent to her pharmacy listed on her profile.  Please call back.

## 2012-10-18 ENCOUNTER — Telehealth: Payer: Self-pay | Admitting: Cardiology

## 2012-10-18 NOTE — Telephone Encounter (Signed)
Spoke with pt, aware tramadol has been e-prescribed. She will need to get the other from her PCP.

## 2012-10-18 NOTE — Telephone Encounter (Signed)
New Prob      Pt as some questions regarding her prescription for TRAMADOL and MIRTASZPINE. Please call.

## 2012-10-19 ENCOUNTER — Telehealth: Payer: Self-pay | Admitting: Internal Medicine

## 2012-10-19 MED ORDER — MIRTAZAPINE 15 MG PO TABS: 7.5000 mg | ORAL_TABLET | Freq: Every day | ORAL | Status: DC

## 2012-10-19 NOTE — Telephone Encounter (Signed)
Inform pt rx sent to pharmacy...lmb

## 2012-10-21 IMAGING — CR DG CHEST 2V
2 series · 2 of 2 positions shown · non-contrast
Comparison: None.

CLINICAL DATA: 83-year-old female with palpitations, malaise.

CHEST - 2 VIEW

[w chest pa]
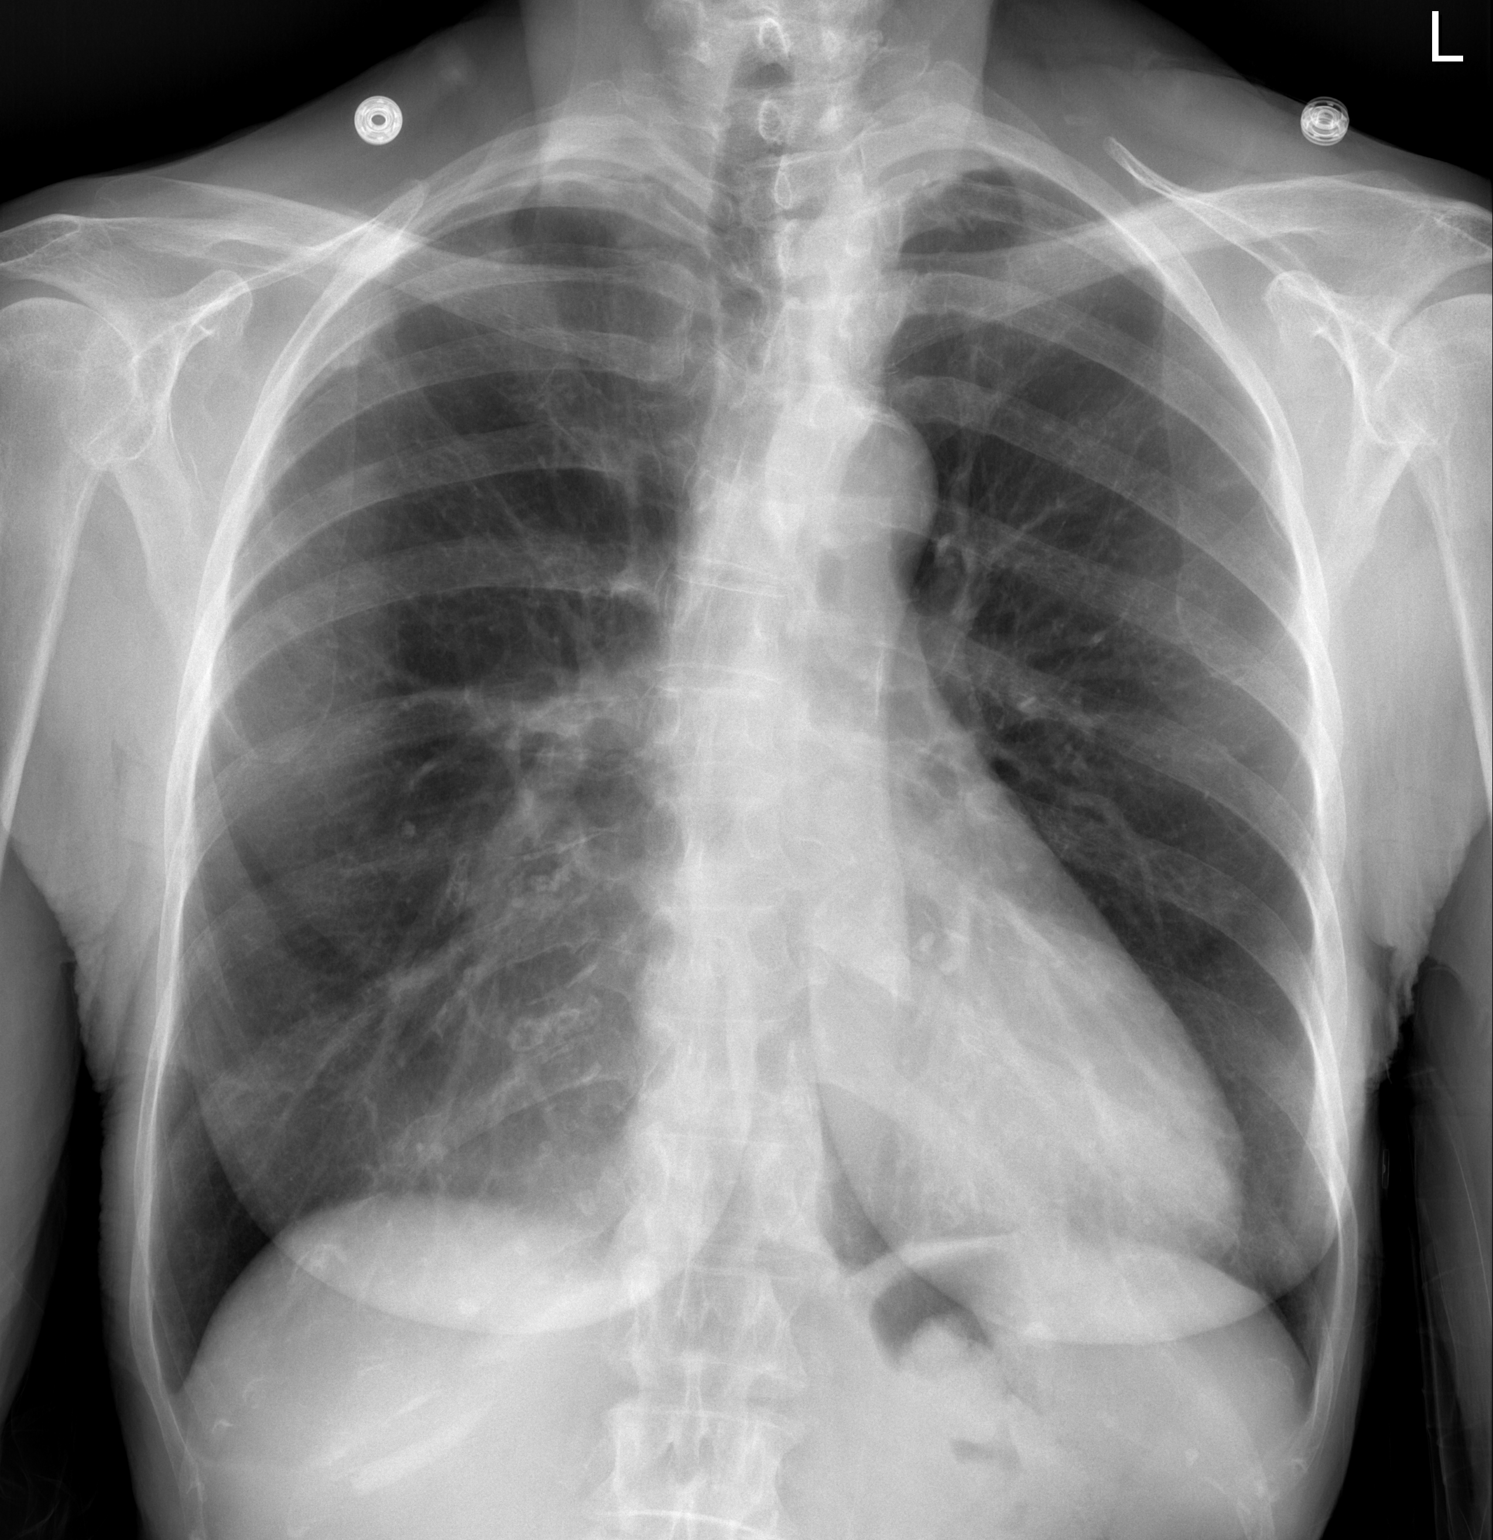

[w chest lat]
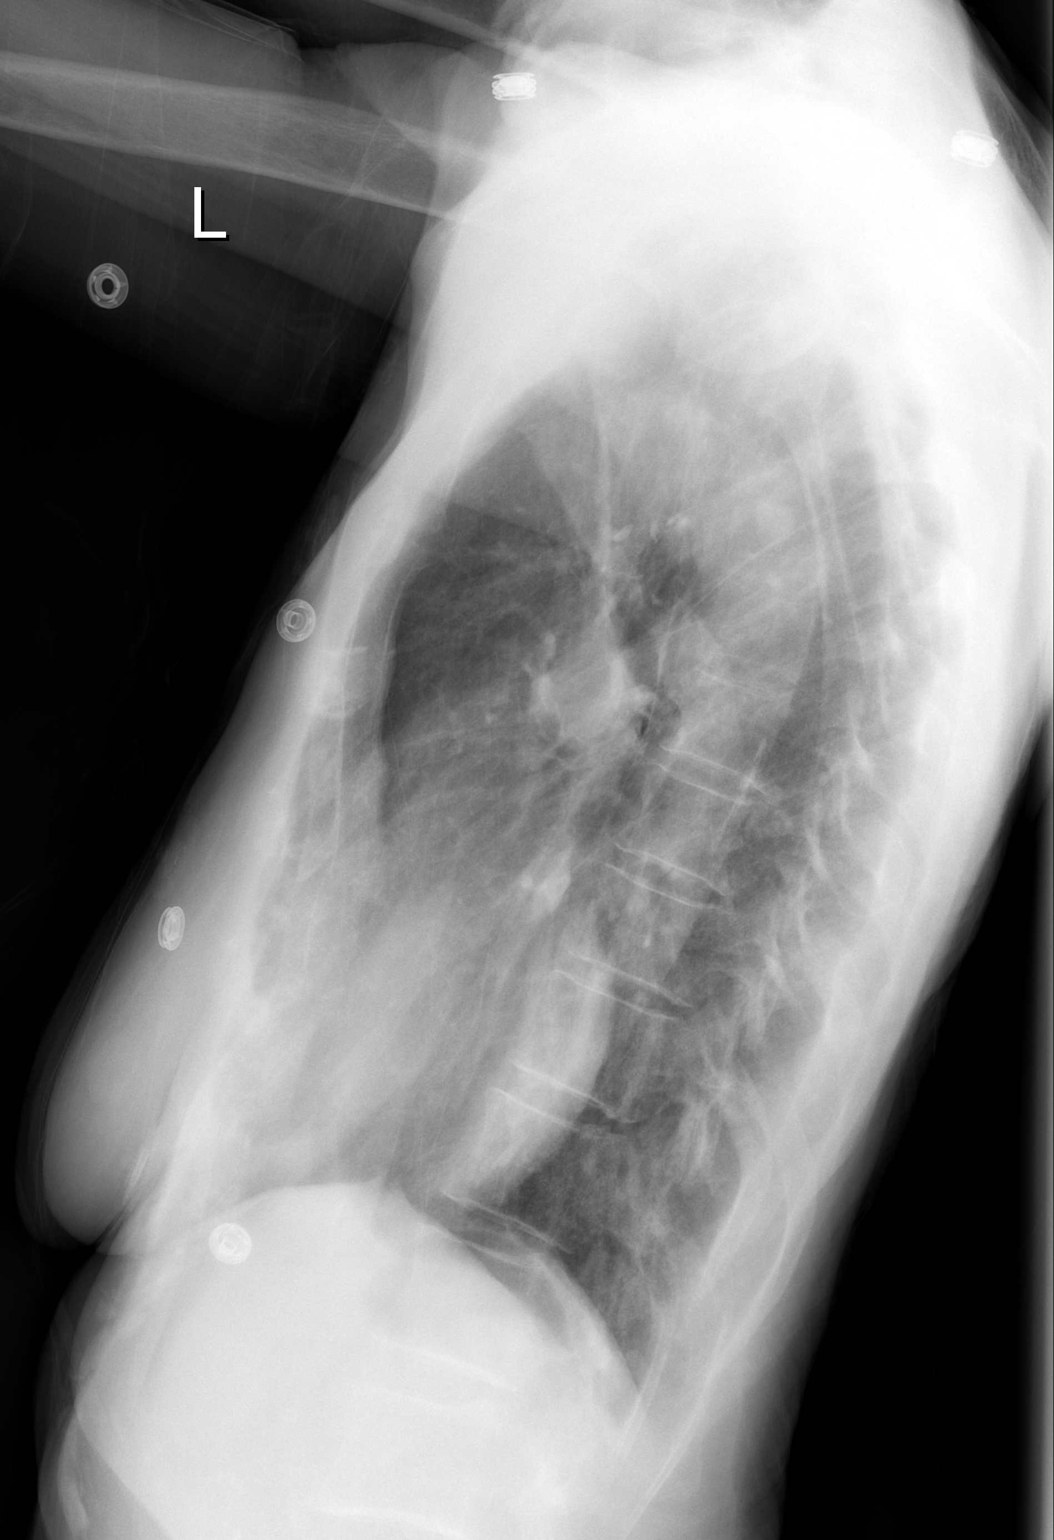

[2 of 2 positions shown; findings below may reference images not displayed]

FINDINGS: Pectus excavatum deformity.  Cardiac size and mediastinal
contours are within normal limits.  There is a degree of pulmonary
hyperinflation.  No pneumothorax, pulmonary edema, pleural effusion
or confluent pulmonary opacity.  Upper thoracic scoliosis. No acute
osseous abnormality identified.
IMPRESSION: No acute cardiopulmonary abnormality.  Pectus excavatum.

## 2012-10-24 DIAGNOSIS — R279 Unspecified lack of coordination: Secondary | ICD-10-CM | POA: Diagnosis not present

## 2012-10-24 DIAGNOSIS — R269 Unspecified abnormalities of gait and mobility: Secondary | ICD-10-CM | POA: Diagnosis not present

## 2012-10-24 DIAGNOSIS — M6281 Muscle weakness (generalized): Secondary | ICD-10-CM | POA: Diagnosis not present

## 2012-10-26 DIAGNOSIS — R279 Unspecified lack of coordination: Secondary | ICD-10-CM | POA: Diagnosis not present

## 2012-10-26 DIAGNOSIS — R269 Unspecified abnormalities of gait and mobility: Secondary | ICD-10-CM | POA: Diagnosis not present

## 2012-10-26 DIAGNOSIS — M6281 Muscle weakness (generalized): Secondary | ICD-10-CM | POA: Diagnosis not present

## 2012-10-31 ENCOUNTER — Telehealth: Payer: Self-pay | Admitting: Cardiology

## 2012-10-31 DIAGNOSIS — R269 Unspecified abnormalities of gait and mobility: Secondary | ICD-10-CM | POA: Diagnosis not present

## 2012-10-31 DIAGNOSIS — R279 Unspecified lack of coordination: Secondary | ICD-10-CM | POA: Diagnosis not present

## 2012-10-31 DIAGNOSIS — M6281 Muscle weakness (generalized): Secondary | ICD-10-CM | POA: Diagnosis not present

## 2012-10-31 NOTE — Telephone Encounter (Signed)
Spoke with Victorino Dike, PT who worked with patient today and noted elevated sporadic manual BPs above patient's normal.  Victorino Dike states patient denies any symptoms - states she "feels fine."  Today's BPs:  Standing 110/82; sitting 174/100; after walking back to her door 184/98; resting 154/100 with heart rate ranging from 88-93.  Victorino Dike states patient's normal range is 118-130/78-82.  I advised her that Dr. Jens Som is not in the office today but that I will discuss with Dr. Elease Hashimoto, DOD to determine if there is anything he wants to do.  Victorino Dike is aware of recent changes in medications that Dr. Jens Som has made and states patient is aware as well.   Dr. Elease Hashimoto advises that Victorino Dike continue to record BPs and to report to Dr. Jens Som next week as long as patient continues to not be symptomatic. I called and reported this to [place] who verbalized understanding.

## 2012-10-31 NOTE — Telephone Encounter (Signed)
New Prob     PT states pts BP was 174/100 when she arrived at therapy. The lowest after resting was 154/100, BP when standing was 110/82 and HR was 93 bpm. HR  decreased 80 bpm after a little while. States BP very speratic today but non symptomatic. PT would like to speak to nurse.

## 2012-11-02 DIAGNOSIS — R279 Unspecified lack of coordination: Secondary | ICD-10-CM | POA: Diagnosis not present

## 2012-11-02 DIAGNOSIS — M6281 Muscle weakness (generalized): Secondary | ICD-10-CM | POA: Diagnosis not present

## 2012-11-02 DIAGNOSIS — R269 Unspecified abnormalities of gait and mobility: Secondary | ICD-10-CM | POA: Diagnosis not present

## 2012-11-08 DIAGNOSIS — R269 Unspecified abnormalities of gait and mobility: Secondary | ICD-10-CM | POA: Diagnosis not present

## 2012-11-08 DIAGNOSIS — M6281 Muscle weakness (generalized): Secondary | ICD-10-CM | POA: Diagnosis not present

## 2012-11-08 DIAGNOSIS — R279 Unspecified lack of coordination: Secondary | ICD-10-CM | POA: Diagnosis not present

## 2012-11-08 NOTE — Telephone Encounter (Signed)
Spoke with [employer], her bp today on arrival at PT her bp sitting was 166/82. When she stood her bp dropped to 140/72. Her heart rate is running 60's and 70's. Will forward for dr Jens Som review

## 2012-11-08 NOTE — Telephone Encounter (Signed)
F/u   Per jennifer/PT-pts b/p elevated today also

## 2012-11-09 NOTE — Telephone Encounter (Signed)
No change; monitor BP Olga Millers

## 2012-11-09 NOTE — Telephone Encounter (Signed)
Left message for jennifer of dr Ludwig Clarks recommendations

## 2012-11-10 ENCOUNTER — Telehealth: Payer: Self-pay | Admitting: Cardiology

## 2012-11-10 DIAGNOSIS — R269 Unspecified abnormalities of gait and mobility: Secondary | ICD-10-CM | POA: Diagnosis not present

## 2012-11-10 DIAGNOSIS — R279 Unspecified lack of coordination: Secondary | ICD-10-CM | POA: Diagnosis not present

## 2012-11-10 DIAGNOSIS — M6281 Muscle weakness (generalized): Secondary | ICD-10-CM | POA: Diagnosis not present

## 2012-11-10 NOTE — Telephone Encounter (Signed)
New Prob     States pt BP was very high after walking (220/104 HR 72), after resting (208/98). Pt is not symptomatic and took her medication this morning. Please call.

## 2012-11-10 NOTE — Telephone Encounter (Signed)
I received a call this evening from [place], the physical therapist has been working with Olivia Gay. She checked her blood pressure earlier today and was in the 200 range. She called the office and left a message but never received a call back. She repeated her blood pressure this evening it remains 200/98. She is due for her evening dose of metoprolol. I reviewed her chart and noted that she has a history of low blood pressures recently resulting in discontinuation of medications. We discussed her home medications which according to our notes indicate that she is only taking metoprolol 25 twice a day however based on what the patient is actually doing, she is taking 12.5 metoprolol twice a day along with enalapril 20 mg daily. I recommended that we increase her metoprolol dose to 25 twice a day as her heart rate was in the 70s this evening. I will also call me the message in the office is patient should have a blood pressure check in the office with likely further adjustment of medications. Victorino Dike read that my instructions and communicated these to Olivia Gay.

## 2012-11-11 ENCOUNTER — Ambulatory Visit: Payer: Medicare Other | Admitting: *Deleted

## 2012-11-11 ENCOUNTER — Telehealth: Payer: Self-pay | Admitting: Internal Medicine

## 2012-11-11 VITALS — BP 180/50 | HR 80 | Wt 112.8 lb

## 2012-11-11 DIAGNOSIS — I10 Essential (primary) hypertension: Secondary | ICD-10-CM

## 2012-11-11 MED ORDER — METOPROLOL TARTRATE 25 MG PO TABS: 25.0000 mg | ORAL_TABLET | Freq: Two times a day (BID) | ORAL | Status: DC

## 2012-11-11 NOTE — Progress Notes (Signed)
Pt here for bp check. She has been going to rehab and they called yesterday because her bp was so high. Today here in the office sitting her bp 180/50 and then standing bp 140/60. She denies any symptoms at all, she states she feels great. meds confirmed and discussed with lori gerhardt np. No changes made at this time. Pt will call with problems.

## 2012-11-28 NOTE — Telephone Encounter (Deleted)
error 

## 2012-11-30 ENCOUNTER — Other Ambulatory Visit: Payer: Self-pay | Admitting: *Deleted

## 2012-11-30 MED ORDER — METOPROLOL TARTRATE 25 MG PO TABS: 25.0000 mg | ORAL_TABLET | Freq: Two times a day (BID) | ORAL | Status: DC

## 2012-12-01 ENCOUNTER — Other Ambulatory Visit: Payer: Self-pay | Admitting: *Deleted

## 2012-12-01 MED ORDER — METOPROLOL TARTRATE 25 MG PO TABS: 25.0000 mg | ORAL_TABLET | Freq: Two times a day (BID) | ORAL | Status: DC

## 2012-12-01 NOTE — Telephone Encounter (Signed)
New Problem  Pt wants to speak with you regarding her prescription.

## 2012-12-01 NOTE — Telephone Encounter (Signed)
Spoke with pt, refill for metoprolol sent to pharm

## 2013-02-12 ENCOUNTER — Other Ambulatory Visit: Payer: Self-pay | Admitting: Internal Medicine

## 2013-04-05 ENCOUNTER — Encounter: Payer: Self-pay | Admitting: Internal Medicine

## 2013-04-05 ENCOUNTER — Ambulatory Visit (INDEPENDENT_AMBULATORY_CARE_PROVIDER_SITE_OTHER): Payer: Medicare Other | Admitting: Internal Medicine

## 2013-04-05 VITALS — BP 132/80 | HR 65 | Temp 98.5°F | Ht 66.0 in | Wt 119.8 lb

## 2013-04-05 DIAGNOSIS — Z0000 Encounter for general adult medical examination without abnormal findings: Secondary | ICD-10-CM | POA: Diagnosis not present

## 2013-04-05 DIAGNOSIS — I10 Essential (primary) hypertension: Secondary | ICD-10-CM

## 2013-04-05 DIAGNOSIS — Z23 Encounter for immunization: Secondary | ICD-10-CM

## 2013-04-05 DIAGNOSIS — A809 Acute poliomyelitis, unspecified: Secondary | ICD-10-CM

## 2013-04-05 NOTE — Assessment & Plan Note (Signed)
Will sign handicap parking tag per request due to same

## 2013-04-05 NOTE — Assessment & Plan Note (Signed)
Well controlled on current medications (change 09/2012 by cards reviewed) continue same No further palpitations (following hosp 04/2010 for same)  BP Readings from Last 3 Encounters:  04/05/13 132/80  11/11/12 180/50  10/11/12 98/60

## 2013-04-05 NOTE — Progress Notes (Signed)
Pre-visit discussion using our clinic review tool. No additional management support is needed unless otherwise documented below in the visit note.  

## 2013-04-05 NOTE — Progress Notes (Signed)
Subjective:    Patient ID: Olivia Gay, female    DOB: September 07, 1926, 79 y.o.   MRN: [id number]  HPI  Patient here for annual wellness exam -  Diet: heart healthy  Physical activity: spry and active  Depression/mood screen: negative  Hearing: intact to slightly raised voice (declines hearing eval for hearing aides)  Visual acuity: grossly normal, performs annual eye exam  ADLs: capable  Fall risk: none  Home safety: good  Cognitive evaluation: intact to orientation, naming, recall and repetition  EOL planning: adv directives, reviewed and I agree   I have personally reviewed and have noted  1. The patient's medical and social history  2. Their use of alcohol, tobacco or illicit drugs  3. Their current medications and supplements  4. The patient's functional ability including ADL's, fall risks, home safety risks and hearing or visual impairment.  5. Diet and physical activities  6. Evidence for depression or mood disorders   Chronic medical issues also reviewed -  HTN - reports compliance with current therapy.  Labile blood pressures reviewed from May 2014.  Pt states this is resolved with discontinuing Amlodipine.  Pt denies hypotensive symptoms.  No CV symptoms.   Anemia - incidental dx 04/2010 hosp, resolved  denies melena, brbpr or change in bowels -  no prior GI eval - no risks for PUD (no tobacco, alcohol or nsaid use) -  taking iron pills as rx'd -  declined colo 06/2010 due to neg FOB series and resolution of anemia on lab recheck  Past Medical History  Diagnosis Date  . Hypertension   . Migraines   . Anemia   . Aortic insufficiency     mild-mod, Echo 12/11and 4/13  . Polio 79 years old   Family History  Problem Relation Age of Onset  . Arthritis Mother   . Stroke Mother   . Hypertension Mother   . Diabetes Father   . Hypertension Father   . Breast cancer Sister   . Hyperlipidemia Sister   . Hypertension Sister    History  Substance Use Topics  .  Smoking status: Former Smoker    Types: Cigarettes    Quit date: 05/25/1960  . Smokeless tobacco: Not on file  . Alcohol Use: No     Review of Systems  Constitutional: Negative for fever, chills, activity change and appetite change.  HENT: Negative for congestion, rhinorrhea and sinus pressure.   Eyes: Negative for discharge and itching.  Respiratory: Negative for choking, chest tightness, shortness of breath and wheezing.   Cardiovascular: Negative for chest pain, palpitations and leg swelling.  Gastrointestinal: Negative for nausea, vomiting, diarrhea, constipation and blood in stool.  Neurological: Negative for dizziness, seizures, syncope and headaches.       Objective:   Physical Exam  Constitutional: She is oriented to person, place, and time. She appears well-developed and well-nourished. No distress.  Neck: Normal range of motion. Neck supple. No thyromegaly present.  Cardiovascular: Normal rate and regular rhythm.   Murmur heard.  Systolic murmur is present with a grade of 3/6  Pulmonary/Chest: Effort normal and breath sounds normal. No respiratory distress. She has no wheezes.  Abdominal: Soft. Bowel sounds are normal. She exhibits no distension. There is no tenderness.  Musculoskeletal: Normal range of motion. She exhibits no edema and no tenderness.  Lymphadenopathy:    She has no cervical adenopathy.  Neurological: She is alert and oriented to person, place, and time.  Skin: Skin is warm and dry. No  rash noted. She is not diaphoretic.  Psychiatric: She has a normal mood and affect. Her behavior is normal. Judgment and thought content normal.    Wt Readings from Last 3 Encounters:  04/05/13 119 lb 12.8 oz (54.341 kg)  11/11/12 112 lb 12.8 oz (51.166 kg)  10/11/12 107 lb (48.535 kg)   BP Readings from Last 3 Encounters:  04/05/13 132/80  11/11/12 180/50  10/11/12 98/60   Lab Results  Component Value Date   WBC 6.7 06/20/2012   HGB 11.2* 06/20/2012   HCT  31.6* 06/20/2012   PLT 145* 06/20/2012   GLUCOSE 94 06/23/2012   CHOL  Value: 174        ATP III CLASSIFICATION:  <200     mg/dL   Desirable  956-213  mg/dL   Borderline High  >=086    mg/dL   High        57/84/6962   TRIG 83 05/16/2010   HDL 76 05/16/2010   LDLCALC  Value: 81        Total Cholesterol/HDL:CHD Risk Coronary Heart Disease Risk Table                     Men   Women  1/2 Average Risk   3.4   3.3  Average Risk       5.0   4.4  2 X Average Risk   9.6   7.1  3 X Average Risk  23.4   11.0        Use the calculated Patient Ratio above and the CHD Risk Table to determine the patient's CHD Risk.        ATP III CLASSIFICATION (LDL):  <100     mg/dL   Optimal  952-841  mg/dL   Near or Above                    Optimal  130-159  mg/dL   Borderline  324-401  mg/dL   High  >027     mg/dL   Very High 25/36/6440   ALT 8 06/19/2012   AST 15 06/19/2012   NA 137 06/23/2012   K 4.1 06/23/2012   CL 100 06/23/2012   CREATININE 0.66 06/23/2012   BUN 6 06/23/2012   CO2 28 06/23/2012   TSH 0.538 06/18/2012   HGBA1C  Value: 5.6 (NOTE)                                                                       According to the ADA Clinical Practice Recommendations for 2011, when HbA1c is used as a screening test:   >=6.5%   Diagnostic of Diabetes Mellitus           (if abnormal result  is confirmed)  5.7-6.4%   Increased risk of developing Diabetes Mellitus  References:Diagnosis and Classification of Diabetes Mellitus,Diabetes Care,2011,34(Suppl 1):S62-S69 and Standards of Medical Care in         Diabetes - 2011,Diabetes Care,2011,34  (Suppl 1):S11-S61. 05/16/2010       Assessment & Plan:   AWV/v70.0 - Today patient counseled on age appropriate routine health concerns for screening and prevention, each reviewed and up to date or declined. Immunizations reviewed and up to  date or declined. Labs reviewed. Risk factors for depression reviewed and negative. Hearing function and visual acuity are intact. ADLs screened and  addressed as needed. Functional ability and level of safety reviewed and appropriate. Education, counseling and referrals performed based on assessed risks today. Patient provided with a copy of personalized plan for preventive services.   Also see problem list. Medications and labs reviewed today

## 2013-04-05 NOTE — Patient Instructions (Addendum)
It was good to see you today.  Your annual flu shot was given and/or updated today.  We have reviewed your prior records including labs and tests today  Medications reviewed, no changes at this time.  Health Maintenance reviewed - all recommended immunizations and age-appropriate screenings are up-to-date or declined.  Please schedule followup in 1 year for annual exam, call sooner if problems.    Signed application for handicap parking tag given to you today. Please take this to the [facility] unable process it for you

## 2013-04-12 ENCOUNTER — Other Ambulatory Visit: Payer: Self-pay | Admitting: Cardiology

## 2013-04-12 NOTE — Telephone Encounter (Signed)
Would have to call the patient and ask about the enalapril

## 2013-04-13 NOTE — Telephone Encounter (Signed)
Patient is still taking this. Should she be? Please advise. Thanks, MI

## 2013-04-13 NOTE — Telephone Encounter (Signed)
Enalapril dced at my last ov Olga Millers

## 2013-04-18 ENCOUNTER — Telehealth: Payer: Self-pay

## 2013-04-18 ENCOUNTER — Other Ambulatory Visit: Payer: Self-pay | Admitting: Cardiology

## 2013-04-18 NOTE — Telephone Encounter (Signed)
Patient called wanting refills on enalapril but it was stopped on her last ov, I called the pharmacy th let them know

## 2013-04-18 NOTE — Telephone Encounter (Signed)
Spoke with pt, she has been taking enalapril and her blood pressure at home is running 150/50. Refills given. She will cont to track bp and call with problems.

## 2013-04-18 NOTE — Telephone Encounter (Signed)
Sent to refills dept

## 2013-08-21 DIAGNOSIS — S0100XA Unspecified open wound of scalp, initial encounter: Secondary | ICD-10-CM | POA: Diagnosis not present

## 2013-08-21 DIAGNOSIS — W1809XA Striking against other object with subsequent fall, initial encounter: Secondary | ICD-10-CM | POA: Diagnosis not present

## 2013-08-21 DIAGNOSIS — I10 Essential (primary) hypertension: Secondary | ICD-10-CM | POA: Diagnosis not present

## 2013-08-30 ENCOUNTER — Encounter: Payer: Self-pay | Admitting: Internal Medicine

## 2013-08-30 ENCOUNTER — Ambulatory Visit (INDEPENDENT_AMBULATORY_CARE_PROVIDER_SITE_OTHER): Payer: Medicare Other | Admitting: Internal Medicine

## 2013-08-30 VITALS — BP 140/100 | HR 82 | Temp 98.1°F | Resp 14 | Wt 113.2 lb

## 2013-08-30 DIAGNOSIS — I10 Essential (primary) hypertension: Secondary | ICD-10-CM

## 2013-08-30 DIAGNOSIS — S0990XA Unspecified injury of head, initial encounter: Secondary | ICD-10-CM | POA: Diagnosis not present

## 2013-08-30 DIAGNOSIS — Z4802 Encounter for removal of sutures: Secondary | ICD-10-CM | POA: Diagnosis not present

## 2013-08-30 DIAGNOSIS — S098XXA Other specified injuries of head, initial encounter: Secondary | ICD-10-CM

## 2013-08-30 NOTE — Patient Instructions (Signed)
Please report warning signs as we discussed.  Worrisome would be change in color , increased pain, fever, or pus production. Minimal Blood Pressure Goal= AVERAGE < 140/90;  Ideal is an AVERAGE < 135/85. This AVERAGE should be calculated from @ least 5-7 BP readings taken @ different times of day on different days of week. You should not respond to isolated BP readings , but rather the AVERAGE for that week .Please bring your  blood pressure cuff to office visits to verify that it is reliable.It  can also be checked against the blood pressure device at the pharmacy. Finger or wrist cuffs are not dependable; an arm cuff is.

## 2013-08-30 NOTE — Progress Notes (Signed)
Pre visit review using our clinic review tool, if applicable. No additional management support is needed unless otherwise documented below in the visit note. 
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
## 2013-10-12 ENCOUNTER — Encounter: Payer: Self-pay | Admitting: *Deleted

## 2013-10-12 ENCOUNTER — Encounter: Payer: Self-pay | Admitting: Cardiology

## 2013-10-12 ENCOUNTER — Ambulatory Visit (INDEPENDENT_AMBULATORY_CARE_PROVIDER_SITE_OTHER): Payer: Medicare Other | Admitting: Cardiology

## 2013-10-12 VITALS — BP 180/60 | HR 65 | Ht 66.5 in | Wt 110.4 lb

## 2013-10-12 DIAGNOSIS — R002 Palpitations: Secondary | ICD-10-CM | POA: Diagnosis not present

## 2013-10-12 DIAGNOSIS — I359 Nonrheumatic aortic valve disorder, unspecified: Secondary | ICD-10-CM | POA: Diagnosis not present

## 2013-10-12 MED ORDER — ENALAPRIL MALEATE 20 MG PO TABS
20.0000 mg | ORAL_TABLET | Freq: Two times a day (BID) | ORAL | Status: DC
Start: 2013-10-12 — End: 2014-10-17

## 2013-10-12 NOTE — Assessment & Plan Note (Signed)
History of aortic stenosis and aortic insufficiency. Repeat echocardiogram.
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
## 2013-10-12 NOTE — Assessment & Plan Note (Signed)
Blood pressure elevated.Increase enalapril to 40 mg daily. Check potassium and renal function in one week.

## 2013-10-12 NOTE — Patient Instructions (Signed)
Your physician wants you to follow-up in: ONE YEAR WITH DR Shelda PalRENSHAW You will receive a reminder letter in the mail two months in advance. If you don't receive a letter, please call our office to schedule the follow-up appointment.   Your physician has requested that you have an echocardiogram. Echocardiography is a painless test that uses sound waves to create images of your heart. It provides your doctor with information about the size and shape of your heart and how well your heart's chambers and valves are working. This procedure takes approximately one hour. There are no restrictions for this procedure.   INCREASE ENALAPRIL TO 20 MG TWICE DAILY  Your physician recommends that you RETURN FOR LAB WORK IN ONE WEEK

## 2013-10-19 ENCOUNTER — Other Ambulatory Visit (INDEPENDENT_AMBULATORY_CARE_PROVIDER_SITE_OTHER): Payer: Medicare Other

## 2013-10-19 DIAGNOSIS — R002 Palpitations: Secondary | ICD-10-CM | POA: Diagnosis not present

## 2013-10-19 DIAGNOSIS — I359 Nonrheumatic aortic valve disorder, unspecified: Secondary | ICD-10-CM | POA: Diagnosis not present

## 2013-10-19 LAB — BASIC METABOLIC PANEL
BUN: 18 mg/dL (ref 6–23)
CALCIUM: 9.3 mg/dL (ref 8.4–10.5)
CHLORIDE: 101 meq/L (ref 96–112)
CO2: 27 mEq/L (ref 19–32)
CREATININE: 1 mg/dL (ref 0.4–1.2)
GFR: 59.42 mL/min — ABNORMAL LOW (ref >60.00)
Glucose, Bld: 98 mg/dL (ref 70–99)
Potassium: 3.9 mEq/L (ref 3.5–5.1)
Sodium: 136 mEq/L (ref 135–145)

## 2013-10-31 ENCOUNTER — Ambulatory Visit (HOSPITAL_COMMUNITY): Payer: Medicare Other | Attending: Cardiovascular Disease | Admitting: Radiology

## 2013-10-31 DIAGNOSIS — I359 Nonrheumatic aortic valve disorder, unspecified: Secondary | ICD-10-CM | POA: Diagnosis not present

## 2013-10-31 DIAGNOSIS — R002 Palpitations: Secondary | ICD-10-CM

## 2013-10-31 NOTE — Progress Notes (Signed)
Echocardiogram performed.  

## 2013-11-28 ENCOUNTER — Other Ambulatory Visit: Payer: Self-pay

## 2013-11-28 MED ORDER — METOPROLOL TARTRATE 25 MG PO TABS: 25.0000 mg | ORAL_TABLET | Freq: Two times a day (BID) | ORAL | Status: DC

## 2013-11-28 NOTE — Telephone Encounter (Signed)
metoprolol tartrate (LOPRESSOR) 25 MG tablet  Take 1 tablet (25 mg total) by mouth 2 (two) times daily.   60 tablet   11   Lewayne BuntingBrian S Crenshaw, MD at 10/12/2013 7:30 AM

## 2014-04-11 ENCOUNTER — Encounter: Payer: Medicare Other | Admitting: Internal Medicine

## 2014-07-01 ENCOUNTER — Other Ambulatory Visit: Payer: Self-pay

## 2014-07-01 MED ORDER — METOPROLOL TARTRATE 25 MG PO TABS: 25.0000 mg | ORAL_TABLET | Freq: Two times a day (BID) | ORAL | Status: DC

## 2014-07-05 ENCOUNTER — Encounter: Payer: Medicare Other | Admitting: Internal Medicine

## 2014-10-17 ENCOUNTER — Other Ambulatory Visit: Payer: Self-pay | Admitting: *Deleted

## 2014-10-17 MED ORDER — ENALAPRIL MALEATE 20 MG PO TABS: 20.0000 mg | ORAL_TABLET | Freq: Two times a day (BID) | ORAL | Status: DC

## 2014-11-15 ENCOUNTER — Other Ambulatory Visit: Payer: Self-pay | Admitting: Cardiology

## 2014-11-22 ENCOUNTER — Telehealth: Payer: Self-pay

## 2014-11-22 NOTE — Telephone Encounter (Signed)
Call to inquire if the patient could come in early prior to exam 7/6; Olivia Gay stated she needed to cancel her apt and would like to reschedule for next August with Dr. Felicity CoyerLeschber. Stated the scheduler would be calling her to set up a new apt.

## 2014-11-28 ENCOUNTER — Encounter: Payer: Medicare Other | Admitting: Internal Medicine

## 2015-01-25 ENCOUNTER — Other Ambulatory Visit: Payer: Self-pay | Admitting: Cardiology

## 2015-02-20 ENCOUNTER — Other Ambulatory Visit: Payer: Self-pay | Admitting: Cardiology

## 2015-02-28 DIAGNOSIS — Z23 Encounter for immunization: Secondary | ICD-10-CM | POA: Diagnosis not present

## 2015-03-18 ENCOUNTER — Other Ambulatory Visit: Payer: Self-pay | Admitting: Cardiology

## 2015-04-04 ENCOUNTER — Encounter: Payer: Medicare Other | Admitting: Internal Medicine

## 2015-06-02 ENCOUNTER — Other Ambulatory Visit: Payer: Self-pay | Admitting: Cardiology

## 2015-06-20 ENCOUNTER — Telehealth: Payer: Self-pay | Admitting: Cardiology

## 2015-06-20 MED ORDER — ENALAPRIL MALEATE 20 MG PO TABS: 20.0000 mg | ORAL_TABLET | Freq: Two times a day (BID) | ORAL | Status: DC

## 2015-06-20 NOTE — Telephone Encounter (Signed)
Refill sent.

## 2015-06-20 NOTE — Telephone Encounter (Signed)
°*  STAT* If patient is at the pharmacy, call can be transferred to refill team.   1. Which medications need to be refilled? (please list name of each medication and dose if known) Enalapril   2. Which pharmacy/location (including street and city if local pharmacy) is medication to be sent to?[facility]  3. Do they need a 30 day or 90 day supply? ?

## 2015-06-24 ENCOUNTER — Ambulatory Visit (INDEPENDENT_AMBULATORY_CARE_PROVIDER_SITE_OTHER): Payer: Medicare Other | Admitting: Physician Assistant

## 2015-06-24 ENCOUNTER — Encounter: Payer: Self-pay | Admitting: Physician Assistant

## 2015-06-24 VITALS — BP 143/88 | HR 112 | Ht 66.0 in | Wt 120.0 lb

## 2015-06-24 DIAGNOSIS — I10 Essential (primary) hypertension: Secondary | ICD-10-CM | POA: Diagnosis not present

## 2015-06-24 DIAGNOSIS — R002 Palpitations: Secondary | ICD-10-CM | POA: Diagnosis not present

## 2015-06-24 DIAGNOSIS — I351 Nonrheumatic aortic (valve) insufficiency: Secondary | ICD-10-CM | POA: Diagnosis not present

## 2015-06-24 MED ORDER — METOPROLOL TARTRATE 25 MG PO TABS: 37.5000 mg | ORAL_TABLET | Freq: Two times a day (BID) | ORAL | Status: DC

## 2015-06-24 NOTE — Assessment & Plan Note (Signed)
Patient remains asymptomatic. Last 2-D echo in 2015. Will not repeat at this time. Follow-up with Dr. Jens Som in 6 months.

## 2015-06-24 NOTE — Assessment & Plan Note (Addendum)
Blood pressure is up a little bit patient has sinus tachycardia at baseline. Will increase metoprolol to 37.5 mg twice a day. she will have it checked at [facility] and call if it remains elevated.
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
## 2015-06-24 NOTE — Patient Instructions (Signed)
Medication Instructions:  Your physician has recommended you make the following change in your medication:  INCREASE Metoprolol to 37.5mg  twice daily. An Rx has bee sent to your pharmacy   Labwork: None ordered  Testing/Procedures: None ordered  Follow-Up: Your physician wants you to follow-up in: 6 months with Dr.Crenshaw You will receive a reminder letter in the mail two months in advance. If you don't receive a letter, please call our office to schedule the follow-up appointment.   Any Other Special Instructions Will Be Listed Below (If Applicable). Please have your blood pressure and heart rate checked regularly at [facility]. Please call the office if your heart rate is 100bpm and above     If you need a refill on your cardiac medications before your next appointment, please call your pharmacy.

## 2015-08-14 ENCOUNTER — Other Ambulatory Visit: Payer: Self-pay | Admitting: Cardiology

## 2015-08-14 NOTE — Telephone Encounter (Signed)
REFILL 

## 2015-10-07 ENCOUNTER — Other Ambulatory Visit: Payer: Self-pay | Admitting: Cardiology

## 2015-10-07 ENCOUNTER — Telehealth: Payer: Self-pay | Admitting: *Deleted

## 2015-10-07 NOTE — Telephone Encounter (Signed)
Rx request sent to pharmacy.  

## 2015-10-07 NOTE — Telephone Encounter (Signed)
Spoke with pt, questions regarding the metoprolol and enalapril answered. Pt verbally confirmed how to take meds

## 2015-10-08 ENCOUNTER — Telehealth: Payer: Self-pay | Admitting: Cardiology

## 2015-10-08 NOTE — Telephone Encounter (Signed)
Received call from pt Pharmacy with question on pt orders for Lopressor.  Pt is supposed to be taking 37.5mg  by mouth TWICE daily. Verbal orders given with correct dosage, pt to receive with no refills until sets follow up appointment.

## 2015-10-08 NOTE — Telephone Encounter (Signed)
New message  Pt c/o medication issue:  1. Name of Medication: metoprolol tartrate (LOPRESSOR) 25 MG tablet  4. What is your medication issue? Two different doses

## 2015-10-10 ENCOUNTER — Ambulatory Visit: Payer: Medicare Other | Admitting: Internal Medicine

## 2015-10-11 ENCOUNTER — Other Ambulatory Visit (INDEPENDENT_AMBULATORY_CARE_PROVIDER_SITE_OTHER): Payer: Medicare Other

## 2015-10-11 ENCOUNTER — Ambulatory Visit (INDEPENDENT_AMBULATORY_CARE_PROVIDER_SITE_OTHER)
Admission: RE | Admit: 2015-10-11 | Discharge: 2015-10-11 | Disposition: A | Discharge status: Home / Self Care | Payer: Medicare Other | Source: Ambulatory Visit | Attending: Internal Medicine | Admitting: Internal Medicine

## 2015-10-11 ENCOUNTER — Ambulatory Visit (INDEPENDENT_AMBULATORY_CARE_PROVIDER_SITE_OTHER): Payer: Medicare Other | Admitting: Internal Medicine

## 2015-10-11 ENCOUNTER — Encounter: Payer: Self-pay | Admitting: Internal Medicine

## 2015-10-11 VITALS — BP 120/80 | HR 42 | Wt 106.0 lb

## 2015-10-11 DIAGNOSIS — W19XXXA Unspecified fall, initial encounter: Secondary | ICD-10-CM

## 2015-10-11 DIAGNOSIS — G4710 Hypersomnia, unspecified: Secondary | ICD-10-CM | POA: Diagnosis not present

## 2015-10-11 DIAGNOSIS — R400 Somnolence: Secondary | ICD-10-CM | POA: Insufficient documentation

## 2015-10-11 DIAGNOSIS — R531 Weakness: Secondary | ICD-10-CM | POA: Diagnosis not present

## 2015-10-11 DIAGNOSIS — N3000 Acute cystitis without hematuria: Secondary | ICD-10-CM

## 2015-10-11 DIAGNOSIS — R202 Paresthesia of skin: Secondary | ICD-10-CM

## 2015-10-11 DIAGNOSIS — M25511 Pain in right shoulder: Secondary | ICD-10-CM

## 2015-10-11 DIAGNOSIS — S4991XA Unspecified injury of right shoulder and upper arm, initial encounter: Secondary | ICD-10-CM | POA: Diagnosis not present

## 2015-10-11 DIAGNOSIS — M25519 Pain in unspecified shoulder: Secondary | ICD-10-CM | POA: Insufficient documentation

## 2015-10-11 DIAGNOSIS — M25512 Pain in left shoulder: Secondary | ICD-10-CM

## 2015-10-11 DIAGNOSIS — S299XXA Unspecified injury of thorax, initial encounter: Secondary | ICD-10-CM | POA: Diagnosis not present

## 2015-10-11 LAB — BASIC METABOLIC PANEL
BUN: 16 mg/dL (ref 6–23)
CHLORIDE: 101 meq/L (ref 96–112)
CO2: 26 mEq/L (ref 19–32)
CREATININE: 0.88 mg/dL (ref 0.40–1.20)
Calcium: 9.7 mg/dL (ref 8.4–10.5)
GFR: 64.6 mL/min (ref >60.00)
Glucose, Bld: 148 mg/dL — ABNORMAL HIGH (ref 70–99)
POTASSIUM: 4.1 meq/L (ref 3.5–5.1)
Sodium: 137 mEq/L (ref 135–145)

## 2015-10-11 LAB — CBC WITH DIFFERENTIAL/PLATELET
BASOS PCT: 0.1 % (ref 0.0–3.0)
Basophils Absolute: 0 10*3/uL (ref 0.0–0.1)
EOS ABS: 0 10*3/uL (ref 0.0–0.7)
EOS PCT: 0 % (ref 0.0–5.0)
HEMATOCRIT: 33.1 % — AB (ref 36.0–46.0)
HEMOGLOBIN: 10.9 g/dL — AB (ref 12.0–15.0)
LYMPHS PCT: 12.7 % (ref 12.0–46.0)
Lymphs Abs: 1.3 10*3/uL (ref 0.7–4.0)
MCHC: 33.1 g/dL (ref 30.0–36.0)
MCV: 76.3 fl — ABNORMAL LOW (ref 78.0–100.0)
Monocytes Absolute: 0.8 10*3/uL (ref 0.1–1.0)
Monocytes Relative: 7.6 % (ref 3.0–12.0)
Neutro Abs: 8.2 10*3/uL — ABNORMAL HIGH (ref 1.4–7.7)
Neutrophils Relative %: 79.6 % — ABNORMAL HIGH (ref 43.0–77.0)
Platelets: 412 10*3/uL — ABNORMAL HIGH (ref 150.0–400.0)
RBC: 4.34 Mil/uL (ref 3.87–5.11)
RDW: 15 % (ref 11.5–15.5)
WBC: 10.4 10*3/uL (ref 4.0–10.5)

## 2015-10-11 LAB — URINALYSIS, ROUTINE W REFLEX MICROSCOPIC
Nitrite: POSITIVE — AB
SPECIFIC GRAVITY, URINE: 1.02 (ref 1.000–1.030)
Total Protein, Urine: 30 — AB
URINE GLUCOSE: NEGATIVE
UROBILINOGEN UA: 4 — AB (ref 0.0–1.0)
pH: 6 (ref 5.0–8.0)

## 2015-10-11 LAB — HEPATIC FUNCTION PANEL
ALT: 10 U/L (ref 0–35)
AST: 17 U/L (ref 0–37)
Albumin: 3.4 g/dL — ABNORMAL LOW (ref 3.5–5.2)
Alkaline Phosphatase: 78 U/L (ref 39–117)
BILIRUBIN DIRECT: 0.2 mg/dL (ref 0.0–0.3)
TOTAL PROTEIN: 6.5 g/dL (ref 6.0–8.3)
Total Bilirubin: 0.9 mg/dL (ref 0.2–1.2)

## 2015-10-11 LAB — SEDIMENTATION RATE: SED RATE: 37 mm/h — AB (ref 0–30)

## 2015-10-11 LAB — TROPONIN I: TNIDX: 0.02 ug/L (ref 0.00–0.06)

## 2015-10-11 LAB — CK: Total CK: 19 U/L (ref 7–177)

## 2015-10-11 LAB — VITAMIN B12: Vitamin B-12: 664 pg/mL (ref 211–911)

## 2015-10-11 LAB — TSH: TSH: 1.64 u[IU]/mL (ref 0.35–4.50)

## 2015-10-11 NOTE — Progress Notes (Signed)
Subjective:  Patient ID: Olivia Gay, female    DOB: [birth year]  Age: 82 y.o. MRN: [id number]  CC: No chief complaint on file.   HPI CYANNA NEACE presents for a fall 3 weeks ago - c/o R shoulder pain - worse C/o leg weakness - worse. C/o poor appetite, wt loss. Lives at [facility] No SOB. Sleeping a lot...  Outpatient Prescriptions Prior to Visit  Medication Sig Dispense Refill  . enalapril (VASOTEC) 20 MG tablet TAKE 1 TABLET TWICE A DAY *NEEDS TO SCHEDULE APPT* 60 tablet 3  . metoprolol tartrate (LOPRESSOR) 25 MG tablet Take 1 tablet (25 mg total) by mouth 2 (two) times daily. Please schedule appointment for refills. (Patient taking differently: Take 37.5 mg by mouth 2 (two) times daily. Please schedule appointment for refills.) 60 tablet 0  . Ascorbic Acid (VITAMIN C) 500 MG tablet Take 500 mg by mouth daily. Reported on 10/11/2015    . Calcium Carbonate-Vitamin D (CALCIUM + D) 600-200 MG-UNIT TABS Take by mouth daily. Reported on 10/11/2015    . ferrous sulfate 325 (65 FE) MG tablet Take 325 mg by mouth daily with breakfast. Reported on 10/11/2015    . Omega-3 Fatty Acids (FISH OIL) 1000 MG CPDR Take 1,000 mg by mouth daily. Reported on 10/11/2015     No facility-administered medications prior to visit.    ROS Review of Systems  Constitutional: Positive for fatigue. Negative for chills, activity change, appetite change and unexpected weight change.  HENT: Negative for congestion, mouth sores and sinus pressure.   Eyes: Negative for visual disturbance.  Respiratory: Negative for cough and chest tightness.   Gastrointestinal: Negative for nausea and abdominal pain.  Genitourinary: Negative for frequency, difficulty urinating and vaginal pain.  Musculoskeletal: Negative for back pain and gait problem.  Skin: Negative for pallor and rash.  Neurological: Positive for weakness and headaches. Negative for dizziness, tremors and numbness.  Psychiatric/Behavioral: Positive for  decreased concentration. Negative for suicidal ideas, confusion and sleep disturbance. The patient is not nervous/anxious.     Objective:  BP 120/80 mmHg  Pulse 42  Wt 106 lb (48.081 kg)  SpO2 81%  BP Readings from Last 3 Encounters:  10/11/15 120/80  06/24/15 143/88  10/12/13 180/60    Wt Readings from Last 3 Encounters:  10/11/15 106 lb (48.081 kg)  06/24/15 120 lb (54.432 kg)  10/12/13 110 lb 6.4 oz (50.077 kg)    Physical Exam  Constitutional: She appears well-developed. No distress.  HENT:  Head: Normocephalic.  Right Ear: External ear normal.  Left Ear: External ear normal.  Nose: Nose normal.  Mouth/Throat: Oropharynx is clear and moist.  Eyes: Conjunctivae are normal. Pupils are equal, round, and reactive to light. Right eye exhibits no discharge. Left eye exhibits no discharge.  Neck: Normal range of motion. Neck supple. No JVD present. No tracheal deviation present. No thyromegaly present.  Cardiovascular: Normal rate, regular rhythm and normal heart sounds.   Pulmonary/Chest: No stridor. No respiratory distress. She has no wheezes.  Abdominal: Soft. Bowel sounds are normal. She exhibits no distension and no mass. There is no tenderness. There is no rebound and no guarding.  Musculoskeletal: She exhibits no edema or tenderness.  Lymphadenopathy:    She has no cervical adenopathy.  Neurological: She displays normal reflexes. No cranial nerve deficit. She exhibits normal muscle tone. Coordination normal.  Skin: No rash noted. No erythema.  Psychiatric: She has a normal mood and affect. Her behavior is normal. Judgment and thought  content normal.    Lab Results  Component Value Date   WBC 6.7 06/20/2012   HGB 11.2* 06/20/2012   HCT 31.6* 06/20/2012   PLT 145* 06/20/2012   GLUCOSE 98 10/19/2013   CHOL  05/16/2010    174        ATP III CLASSIFICATION:  <200     mg/dL   Desirable  147-829  mg/dL   Borderline High  >=562    mg/dL   High          TRIG 83  05/16/2010   HDL 76 05/16/2010   LDLCALC  05/16/2010    81        Total Cholesterol/HDL:CHD Risk Coronary Heart Disease Risk Table                     Men   Women  1/2 Average Risk   3.4   3.3  Average Risk       5.0   4.4  2 X Average Risk   9.6   7.1  3 X Average Risk  23.4   11.0        Use the calculated Patient Ratio above and the CHD Risk Table to determine the patient's CHD Risk.        ATP III CLASSIFICATION (LDL):  <100     mg/dL   Optimal  130-865  mg/dL   Near or Above                    Optimal  130-159  mg/dL   Borderline  784-696  mg/dL   High  >295     mg/dL   Very High   ALT 8 28/41/3244   AST 15 06/19/2012   NA 136 10/19/2013   K 3.9 10/19/2013   CL 101 10/19/2013   CREATININE 1.0 10/19/2013   BUN 18 10/19/2013   CO2 27 10/19/2013   TSH 0.538 06/18/2012   HGBA1C  05/16/2010    5.6 (NOTE)                                                                       According to the ADA Clinical Practice Recommendations for 2011, when HbA1c is used as a screening test:   >=6.5%   Diagnostic of Diabetes Mellitus           (if abnormal result  is confirmed)  5.7-6.4%   Increased risk of developing Diabetes Mellitus  References:Diagnosis and Classification of Diabetes Mellitus,Diabetes Care,2011,34(Suppl 1):S62-S69 and Standards of Medical Care in         Diabetes - 2011,Diabetes Care,2011,34  (Suppl 1):S11-S61.    Dg Chest Portable 1 View  06/18/2012  *RADIOLOGY REPORT* Clinical Data: Altered mental status, hypotension, dehydration, weakness and vomiting. PORTABLE CHEST - 1 VIEW Comparison: Chest radiograph performed 05/15/2010, and CTA of the chest performed 05/16/2010 Findings: The lungs are well-aerated and clear.  There is no evidence of focal opacification, pleural effusion or pneumothorax. Pectus excavatum is again noted, with haziness at the right midlung zone. The cardiomediastinal silhouette is normal in size; calcification is noted within the aortic arch.  No  acute osseous abnormalities are seen. IMPRESSION: No acute cardiopulmonary process seen. Original Report Authenticated  By: Tonia GhentJeffrey Chang, M.D.    Assessment & Plan:   There are no diagnoses linked to this encounter. I am having Ms. Schuyler maintain her Calcium Carbonate-Vitamin D, ferrous sulfate, Fish Oil, vitamin C, enalapril, and metoprolol tartrate.  No orders of the defined types were placed in this encounter.     Follow-up: No Follow-up on file.  Sonda PrimesAlex Makenzie Vittorio, MD

## 2015-10-11 NOTE — Assessment & Plan Note (Signed)
5/17 x 3 wks ?etiology

## 2015-10-11 NOTE — Patient Instructions (Addendum)
To ER if worse

## 2015-10-11 NOTE — Assessment & Plan Note (Addendum)
Labs/CXR R/o UTI To ER if issues are worse

## 2015-10-11 NOTE — Progress Notes (Signed)
Pre visit review using our clinic review tool, if applicable. No additional management support is needed unless otherwise documented below in the visit note. 

## 2015-10-12 DIAGNOSIS — N390 Urinary tract infection, site not specified: Secondary | ICD-10-CM | POA: Insufficient documentation

## 2015-10-12 MED ORDER — CEFUROXIME AXETIL 250 MG PO TABS: 250.0000 mg | ORAL_TABLET | Freq: Two times a day (BID) | ORAL | Status: DC

## 2015-10-12 NOTE — Assessment & Plan Note (Signed)
Ceftin x7d Probiotic

## 2015-10-12 NOTE — Assessment & Plan Note (Signed)
Likely a rot cuff strain/tear R X ray PT/Ortho if needed

## 2015-10-12 NOTE — Assessment & Plan Note (Signed)
R shoulder X ray Labs, CXR

## 2015-11-04 ENCOUNTER — Encounter: Payer: Self-pay | Admitting: Internal Medicine

## 2015-11-04 ENCOUNTER — Ambulatory Visit (INDEPENDENT_AMBULATORY_CARE_PROVIDER_SITE_OTHER): Payer: Medicare Other | Admitting: Internal Medicine

## 2015-11-04 ENCOUNTER — Other Ambulatory Visit (INDEPENDENT_AMBULATORY_CARE_PROVIDER_SITE_OTHER): Payer: Medicare Other

## 2015-11-04 VITALS — BP 160/80 | HR 67 | Wt 103.0 lb

## 2015-11-04 DIAGNOSIS — M25511 Pain in right shoulder: Secondary | ICD-10-CM

## 2015-11-04 DIAGNOSIS — F4323 Adjustment disorder with mixed anxiety and depressed mood: Secondary | ICD-10-CM | POA: Diagnosis not present

## 2015-11-04 DIAGNOSIS — N3000 Acute cystitis without hematuria: Secondary | ICD-10-CM

## 2015-11-04 DIAGNOSIS — W19XXXS Unspecified fall, sequela: Secondary | ICD-10-CM

## 2015-11-04 LAB — URINALYSIS, ROUTINE W REFLEX MICROSCOPIC
HGB URINE DIPSTICK: NEGATIVE
KETONES UR: NEGATIVE
Nitrite: NEGATIVE
PH: 6.5 (ref 5.0–8.0)
Specific Gravity, Urine: 1.01 (ref 1.000–1.030)
Urine Glucose: NEGATIVE
Urobilinogen, UA: 1 (ref 0.0–1.0)

## 2015-11-04 MED ORDER — MIRTAZAPINE 15 MG PO TABS: ORAL_TABLET | ORAL | Status: DC

## 2015-11-04 NOTE — Progress Notes (Signed)
Subjective:  Patient ID: Olivia Gay, female    DOB: 1927-03-08  Age: 82 y.o. MRN: [id number]  CC: No chief complaint on file.   HPI Olivia Gay presents for a UTI f/u Feeling stronger and better. R shoulder is better. Pt took 4 d of abx - stopped after the diarrhea - stopped  Outpatient Prescriptions Prior to Visit  Medication Sig Dispense Refill  . Ascorbic Acid (VITAMIN C) 500 MG tablet Take 500 mg by mouth daily. Reported on 10/11/2015    . Calcium Carbonate-Vitamin D (CALCIUM + D) 600-200 MG-UNIT TABS Take by mouth daily. Reported on 10/11/2015    . cefUROXime (CEFTIN) 250 MG tablet Take 1 tablet (250 mg total) by mouth 2 (two) times daily. 14 tablet 1  . enalapril (VASOTEC) 20 MG tablet TAKE 1 TABLET TWICE A DAY *NEEDS TO SCHEDULE APPT* 60 tablet 3  . ferrous sulfate 325 (65 FE) MG tablet Take 325 mg by mouth daily with breakfast. Reported on 10/11/2015    . metoprolol tartrate (LOPRESSOR) 25 MG tablet Take 1 tablet (25 mg total) by mouth 2 (two) times daily. Please schedule appointment for refills. (Patient taking differently: Take 37.5 mg by mouth 2 (two) times daily. Please schedule appointment for refills.) 60 tablet 0  . Omega-3 Fatty Acids (FISH OIL) 1000 MG CPDR Take 1,000 mg by mouth daily. Reported on 10/11/2015     No facility-administered medications prior to visit.    ROS Review of Systems  Constitutional: Positive for fatigue. Negative for chills, activity change, appetite change and unexpected weight change.  HENT: Negative for congestion, mouth sores and sinus pressure.   Eyes: Negative for visual disturbance.  Respiratory: Negative for cough and chest tightness.   Gastrointestinal: Negative for nausea and abdominal pain.  Genitourinary: Negative for frequency, difficulty urinating and vaginal pain.  Musculoskeletal: Negative for back pain and gait problem.  Skin: Negative for pallor and rash.  Neurological: Negative for dizziness, tremors, weakness,  numbness and headaches.  Psychiatric/Behavioral: Positive for decreased concentration. Negative for suicidal ideas, confusion and sleep disturbance.    Objective:  BP 160/80 mmHg  Pulse 67  Wt 103 lb (46.72 kg)  SpO2 96%  BP Readings from Last 3 Encounters:  11/04/15 160/80  10/11/15 120/80  06/24/15 143/88    Wt Readings from Last 3 Encounters:  11/04/15 103 lb (46.72 kg)  10/11/15 106 lb (48.081 kg)  06/24/15 120 lb (54.432 kg)    Physical Exam  Constitutional: She appears well-developed. No distress.  HENT:  Head: Normocephalic.  Right Ear: External ear normal.  Left Ear: External ear normal.  Nose: Nose normal.  Mouth/Throat: Oropharynx is clear and moist.  Eyes: Conjunctivae are normal. Pupils are equal, round, and reactive to light. Right eye exhibits no discharge. Left eye exhibits no discharge.  Neck: Normal range of motion. Neck supple. No JVD present. No tracheal deviation present. No thyromegaly present.  Cardiovascular: Normal rate, regular rhythm and normal heart sounds.   Pulmonary/Chest: No stridor. No respiratory distress. She has no wheezes.  Abdominal: Soft. Bowel sounds are normal. She exhibits no distension and no mass. There is no tenderness. There is no rebound and no guarding.  Musculoskeletal: She exhibits tenderness. She exhibits no edema.  Lymphadenopathy:    She has no cervical adenopathy.  Neurological: She displays normal reflexes. No cranial nerve deficit. She exhibits normal muscle tone. Coordination normal.  Skin: No rash noted. No erythema.  Psychiatric: She has a normal mood and affect. Her  behavior is normal. Judgment and thought content normal.  in a w/c Sad R shoulder is tender w/ROM  Lab Results  Component Value Date   WBC 10.4 10/11/2015   HGB 10.9* 10/11/2015   HCT 33.1* 10/11/2015   PLT 412.0* 10/11/2015   GLUCOSE 148* 10/11/2015   CHOL  05/16/2010    174        ATP III CLASSIFICATION:  <200     mg/dL   Desirable   161-096  mg/dL   Borderline High  >=045    mg/dL   High          TRIG 83 05/16/2010   HDL 76 05/16/2010   LDLCALC  05/16/2010    81        Total Cholesterol/HDL:CHD Risk Coronary Heart Disease Risk Table                     Men   Women  1/2 Average Risk   3.4   3.3  Average Risk       5.0   4.4  2 X Average Risk   9.6   7.1  3 X Average Risk  23.4   11.0        Use the calculated Patient Ratio above and the CHD Risk Table to determine the patient's CHD Risk.        ATP III CLASSIFICATION (LDL):  <100     mg/dL   Optimal  409-811  mg/dL   Near or Above                    Optimal  130-159  mg/dL   Borderline  914-782  mg/dL   High  >956     mg/dL   Very High   ALT 10 21/30/8657   AST 17 10/11/2015   NA 137 10/11/2015   K 4.1 10/11/2015   CL 101 10/11/2015   CREATININE 0.88 10/11/2015   BUN 16 10/11/2015   CO2 26 10/11/2015   TSH 1.64 10/11/2015   HGBA1C  05/16/2010    5.6 (NOTE)                                                                       According to the ADA Clinical Practice Recommendations for 2011, when HbA1c is used as a screening test:   >=6.5%   Diagnostic of Diabetes Mellitus           (if abnormal result  is confirmed)  5.7-6.4%   Increased risk of developing Diabetes Mellitus  References:Diagnosis and Classification of Diabetes Mellitus,Diabetes Care,2011,34(Suppl 1):S62-S69 and Standards of Medical Care in         Diabetes - 2011,Diabetes Care,2011,34  (Suppl 1):S11-S61.    Dg Chest 2 View  10/11/2015  CLINICAL DATA:  Weakness for 3 weeks, fall, initial encounter EXAM: CHEST  2 VIEW COMPARISON:  06/18/2012 FINDINGS: Cardiomediastinal silhouette is stable. No infiltrate or pleural effusion. No pulmonary edema. Atherosclerotic calcifications of thoracic aorta. Bony thorax is unremarkable. IMPRESSION: No active cardiopulmonary disease. Electronically Signed   By: Natasha Mead M.D.   On: 10/11/2015 11:12   Dg Shoulder Right  10/11/2015  CLINICAL DATA:  Fall 3  weeks ago, limited range of  motion EXAM: RIGHT SHOULDER - 2+ VIEW COMPARISON:  None. FINDINGS: Three views of the right shoulder submitted. No acute fracture or or subluxation. Mild narrowing of glenohumeral joint space. Mild degenerative changes AC joint. IMPRESSION: No acute fracture or subluxation.  Mild degenerative changes. Electronically Signed   By: Natasha Mead M.D.   On: 10/11/2015 11:16    Assessment & Plan:   There are no diagnoses linked to this encounter. I am having Ms. Iversen maintain her Calcium Carbonate-Vitamin D, ferrous sulfate, Fish Oil, vitamin C, enalapril, metoprolol tartrate, and cefUROXime.  No orders of the defined types were placed in this encounter.     Follow-up: No Follow-up on file.  Sonda Primes, MD

## 2015-11-04 NOTE — Assessment & Plan Note (Signed)
6/17 Re-start Remeron (she did well on it before) Socialize more

## 2015-11-04 NOTE — Patient Instructions (Signed)
Have dinner in a dining room

## 2015-11-04 NOTE — Progress Notes (Signed)
Pre visit review using our clinic review tool, if applicable. No additional management support is needed unless otherwise documented below in the visit note. 

## 2015-11-04 NOTE — Assessment & Plan Note (Signed)
No repeat falls Start PT

## 2015-11-04 NOTE — Assessment & Plan Note (Signed)
UA, cx

## 2015-11-04 NOTE — Assessment & Plan Note (Signed)
PT

## 2015-11-07 LAB — CULTURE, URINE COMPREHENSIVE: Colony Count: >=100000

## 2015-11-14 ENCOUNTER — Other Ambulatory Visit: Payer: Self-pay | Admitting: Cardiology

## 2015-11-14 NOTE — Telephone Encounter (Signed)
Rx(s) sent to pharmacy electronically.  

## 2015-12-04 ENCOUNTER — Telehealth: Payer: Self-pay | Admitting: Cardiology

## 2015-12-04 ENCOUNTER — Ambulatory Visit: Payer: Medicare Other | Admitting: Internal Medicine

## 2015-12-04 MED ORDER — ENALAPRIL MALEATE 20 MG PO TABS: ORAL_TABLET | ORAL | Status: DC

## 2015-12-04 MED ORDER — METOPROLOL TARTRATE 25 MG PO TABS: ORAL_TABLET | ORAL | Status: DC

## 2015-12-04 NOTE — Telephone Encounter (Signed)
Verified with daughter-in-law patient taking Metoprolol 25 mg 1 and 1/2 tablet twice a day  She asked for 90 days of meds and is aware of follow up appointment

## 2015-12-04 NOTE — Telephone Encounter (Signed)
*  STAT* If patient is at the pharmacy, call can be transferred to refill team.   1. Which medications need to be refilled? (please list name of each medication and dose if known) Metoprolol  2. Which pharmacy/location (including street and city if local pharmacy) is medication to be sent to?[facility] [place]  3. Do they need a 30 day or 90 day supply? 45  Wanted to see if she can receive 3 month refills- pt dtr in law requested to speak w/ rN . Please call back and discuss.

## 2015-12-13 ENCOUNTER — Encounter: Payer: Self-pay | Admitting: Internal Medicine

## 2015-12-13 ENCOUNTER — Ambulatory Visit (INDEPENDENT_AMBULATORY_CARE_PROVIDER_SITE_OTHER): Payer: Medicare Other | Admitting: Internal Medicine

## 2015-12-13 VITALS — BP 132/66 | HR 83 | Temp 97.9°F | Resp 16 | Wt 102.0 lb

## 2015-12-13 DIAGNOSIS — I351 Nonrheumatic aortic (valve) insufficiency: Secondary | ICD-10-CM

## 2015-12-13 DIAGNOSIS — R531 Weakness: Secondary | ICD-10-CM | POA: Diagnosis not present

## 2015-12-13 DIAGNOSIS — M25511 Pain in right shoulder: Secondary | ICD-10-CM

## 2015-12-13 DIAGNOSIS — F4323 Adjustment disorder with mixed anxiety and depressed mood: Secondary | ICD-10-CM | POA: Diagnosis not present

## 2015-12-13 DIAGNOSIS — I10 Essential (primary) hypertension: Secondary | ICD-10-CM

## 2015-12-13 MED ORDER — MIRTAZAPINE 15 MG PO TABS: 15.0000 mg | ORAL_TABLET | Freq: Every day | ORAL | Status: DC

## 2015-12-13 NOTE — Progress Notes (Signed)
Pre visit review using our clinic review tool, if applicable. No additional management support is needed unless otherwise documented below in the visit note. 
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
## 2015-12-13 NOTE — Assessment & Plan Note (Signed)
Improved Will start PT soon

## 2015-12-13 NOTE — Patient Instructions (Addendum)
   Medications reviewed and updated.  Changes include increasing the mirtazapine to one full pill (15 mg ) nightly.   Your prescription(s) have been submitted to your pharmacy. Please take as directed and contact our office if you believe you are having problem(s) with the medication(s).   Please followup in 2 months

## 2015-12-13 NOTE — Assessment & Plan Note (Signed)
Doing well on remeron 7.5 mg nightly, depression improved and no side effects Still with some depression - will try increasing remeron to 15 mg daily - call if develops any side effects Follow up in 2 months sooner if needed encouraged to socialize more

## 2015-12-13 NOTE — Assessment & Plan Note (Signed)
BP well controlled Current regimen effective and well tolerated Continue current medications at current doses  

## 2015-12-13 NOTE — Assessment & Plan Note (Signed)
Asymptomatic Following with Dr Jens Somrenshaw

## 2015-12-13 NOTE — Assessment & Plan Note (Signed)
Improving Using walker Will start PT soon

## 2016-02-14 ENCOUNTER — Ambulatory Visit: Payer: Medicare Other | Admitting: Internal Medicine
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
## 2016-02-17 ENCOUNTER — Encounter: Payer: Self-pay | Admitting: Cardiology

## 2016-02-17 ENCOUNTER — Ambulatory Visit (INDEPENDENT_AMBULATORY_CARE_PROVIDER_SITE_OTHER): Payer: Medicare Other | Admitting: Cardiology

## 2016-02-17 VITALS — BP 116/72 | HR 70 | Ht 66.0 in | Wt 103.0 lb

## 2016-02-17 DIAGNOSIS — I359 Nonrheumatic aortic valve disorder, unspecified: Secondary | ICD-10-CM

## 2016-02-17 DIAGNOSIS — I10 Essential (primary) hypertension: Secondary | ICD-10-CM

## 2016-02-17 DIAGNOSIS — I351 Nonrheumatic aortic (valve) insufficiency: Secondary | ICD-10-CM | POA: Diagnosis not present

## 2016-02-17 NOTE — Patient Instructions (Signed)

## 2016-03-01 ENCOUNTER — Other Ambulatory Visit: Payer: Self-pay | Admitting: Cardiology

## 2016-03-02 ENCOUNTER — Encounter: Payer: Self-pay | Admitting: Internal Medicine

## 2016-03-02 ENCOUNTER — Ambulatory Visit (INDEPENDENT_AMBULATORY_CARE_PROVIDER_SITE_OTHER): Payer: Medicare Other | Admitting: Internal Medicine

## 2016-03-02 VITALS — BP 118/68 | Temp 97.8°F | Ht 66.0 in | Wt 104.0 lb

## 2016-03-02 DIAGNOSIS — Z23 Encounter for immunization: Secondary | ICD-10-CM

## 2016-03-02 DIAGNOSIS — W19XXXS Unspecified fall, sequela: Secondary | ICD-10-CM

## 2016-03-02 DIAGNOSIS — I10 Essential (primary) hypertension: Secondary | ICD-10-CM | POA: Diagnosis not present

## 2016-03-02 DIAGNOSIS — R531 Weakness: Secondary | ICD-10-CM | POA: Diagnosis not present

## 2016-03-02 DIAGNOSIS — F4323 Adjustment disorder with mixed anxiety and depressed mood: Secondary | ICD-10-CM

## 2016-03-02 NOTE — Assessment & Plan Note (Signed)
No fall since she was here last Uses walker

## 2016-03-02 NOTE — Assessment & Plan Note (Signed)
BP well controlled Current regimen effective and well tolerated Continue current medications at current doses  
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
## 2016-03-02 NOTE — Patient Instructions (Addendum)
   All other Health Maintenance issues reviewed.   All recommended immunizations and age-appropriate screenings are up-to-date or discussed.  Flu vaccine administered today.   Medications reviewed and updated.  No changes recommended at this time.   Please followup in 6 months   

## 2016-03-02 NOTE — Assessment & Plan Note (Signed)
Improved to remeron Tolerating the current remeron dose without side effects continue remeron 15 mg at night

## 2016-03-02 NOTE — Progress Notes (Signed)
Pre visit review using our clinic review tool, if applicable. No additional management support is needed unless otherwise documented below in the visit note. 

## 2016-03-02 NOTE — Assessment & Plan Note (Signed)
Related to depression Improved to remeron Tolerating the current remeron dose without side effects continue remeron 15 mg at night

## 2016-03-06 ENCOUNTER — Other Ambulatory Visit (HOSPITAL_COMMUNITY): Payer: Medicare Other

## 2016-03-09 ENCOUNTER — Other Ambulatory Visit (HOSPITAL_COMMUNITY): Payer: Medicare Other

## 2016-03-20 ENCOUNTER — Other Ambulatory Visit: Payer: Self-pay

## 2016-03-20 ENCOUNTER — Ambulatory Visit (HOSPITAL_COMMUNITY): Payer: Medicare Other | Attending: Cardiovascular Disease

## 2016-03-20 DIAGNOSIS — I351 Nonrheumatic aortic (valve) insufficiency: Secondary | ICD-10-CM | POA: Diagnosis not present

## 2016-03-20 DIAGNOSIS — I10 Essential (primary) hypertension: Secondary | ICD-10-CM | POA: Diagnosis not present

## 2016-03-20 DIAGNOSIS — Z87891 Personal history of nicotine dependence: Secondary | ICD-10-CM | POA: Insufficient documentation

## 2016-03-20 DIAGNOSIS — D649 Anemia, unspecified: Secondary | ICD-10-CM | POA: Insufficient documentation

## 2016-03-20 DIAGNOSIS — R002 Palpitations: Secondary | ICD-10-CM | POA: Insufficient documentation

## 2016-03-20 DIAGNOSIS — A809 Acute poliomyelitis, unspecified: Secondary | ICD-10-CM | POA: Insufficient documentation

## 2016-03-20 DIAGNOSIS — I517 Cardiomegaly: Secondary | ICD-10-CM | POA: Diagnosis not present

## 2016-03-20 DIAGNOSIS — I359 Nonrheumatic aortic valve disorder, unspecified: Secondary | ICD-10-CM

## 2016-04-07 ENCOUNTER — Other Ambulatory Visit: Payer: Self-pay | Admitting: Cardiology

## 2016-05-13 ENCOUNTER — Other Ambulatory Visit: Payer: Self-pay | Admitting: Internal Medicine

## 2016-05-27 ENCOUNTER — Other Ambulatory Visit: Payer: Self-pay | Admitting: Cardiology

## 2016-05-29 ENCOUNTER — Ambulatory Visit: Payer: Medicare Other

## 2016-08-29 NOTE — Patient Instructions (Addendum)
  Test(s) ordered today. Your results will be released to MyChart (or called to you) after review, usually within 72hours after test completion. If any changes need to be made, you will be notified at that same time.  Medications reviewed and updated.  No changes recommended at this time.    Please followup in 6 months   
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
## 2016-08-31 ENCOUNTER — Encounter: Payer: Self-pay | Admitting: Internal Medicine

## 2016-08-31 ENCOUNTER — Other Ambulatory Visit (INDEPENDENT_AMBULATORY_CARE_PROVIDER_SITE_OTHER): Payer: Medicare Other

## 2016-08-31 ENCOUNTER — Ambulatory Visit (INDEPENDENT_AMBULATORY_CARE_PROVIDER_SITE_OTHER): Payer: Medicare Other | Admitting: Internal Medicine

## 2016-08-31 VITALS — BP 184/94 | HR 71 | Temp 98.0°F | Resp 16 | Wt 115.0 lb

## 2016-08-31 DIAGNOSIS — I10 Essential (primary) hypertension: Secondary | ICD-10-CM | POA: Diagnosis not present

## 2016-08-31 DIAGNOSIS — I351 Nonrheumatic aortic (valve) insufficiency: Secondary | ICD-10-CM | POA: Diagnosis not present

## 2016-08-31 DIAGNOSIS — D649 Anemia, unspecified: Secondary | ICD-10-CM

## 2016-08-31 DIAGNOSIS — F4323 Adjustment disorder with mixed anxiety and depressed mood: Secondary | ICD-10-CM

## 2016-08-31 LAB — COMPREHENSIVE METABOLIC PANEL
ALBUMIN: 4.2 g/dL (ref 3.5–5.2)
ALK PHOS: 59 U/L (ref 39–117)
ALT: 12 U/L (ref 0–35)
AST: 18 U/L (ref 0–37)
BUN: 27 mg/dL — ABNORMAL HIGH (ref 6–23)
CALCIUM: 9.8 mg/dL (ref 8.4–10.5)
CHLORIDE: 106 meq/L (ref 96–112)
CO2: 29 mEq/L (ref 19–32)
CREATININE: 0.95 mg/dL (ref 0.40–1.20)
GFR: 59.02 mL/min — ABNORMAL LOW (ref >60.00)
Glucose, Bld: 97 mg/dL (ref 70–99)
Potassium: 4.4 mEq/L (ref 3.5–5.1)
Sodium: 141 mEq/L (ref 135–145)
TOTAL PROTEIN: 6.8 g/dL (ref 6.0–8.3)
Total Bilirubin: 0.6 mg/dL (ref 0.2–1.2)

## 2016-08-31 LAB — CBC WITH DIFFERENTIAL/PLATELET
BASOS ABS: 0 10*3/uL (ref 0.0–0.1)
BASOS PCT: 0.2 % (ref 0.0–3.0)
EOS ABS: 0 10*3/uL (ref 0.0–0.7)
Eosinophils Relative: 0.5 % (ref 0.0–5.0)
HEMATOCRIT: 40.2 % (ref 36.0–46.0)
HEMOGLOBIN: 13.5 g/dL (ref 12.0–15.0)
LYMPHS PCT: 32.5 % (ref 12.0–46.0)
Lymphs Abs: 1.8 10*3/uL (ref 0.7–4.0)
MCHC: 33.7 g/dL (ref 30.0–36.0)
MCV: 83.5 fl (ref 78.0–100.0)
Monocytes Absolute: 0.5 10*3/uL (ref 0.1–1.0)
Monocytes Relative: 8.7 % (ref 3.0–12.0)
Neutro Abs: 3.2 10*3/uL (ref 1.4–7.7)
Neutrophils Relative %: 58.1 % (ref 43.0–77.0)
Platelets: 174 10*3/uL (ref 150.0–400.0)
RBC: 4.81 Mil/uL (ref 3.87–5.11)
RDW: 14.3 % (ref 11.5–15.5)
WBC: 5.4 10*3/uL (ref 4.0–10.5)

## 2016-08-31 LAB — FERRITIN: FERRITIN: 129.7 ng/mL (ref 10.0–291.0)

## 2016-08-31 LAB — TSH: TSH: 1.29 u[IU]/mL (ref 0.35–4.50)

## 2016-08-31 LAB — IRON: IRON: 85 ug/dL (ref 42–145)

## 2016-08-31 NOTE — Progress Notes (Signed)
Pre visit review using our clinic review tool, if applicable. No additional management support is needed unless otherwise documented below in the visit note. 

## 2016-08-31 NOTE — Assessment & Plan Note (Signed)
Elevated here today - recheck today Will have it monitored at [facility] Continue current medications tsh, cbc, cmp

## 2016-08-31 NOTE — Assessment & Plan Note (Signed)
Mild-moderate by echo 10/17 Asymptomatic Sees Dr Jens Som annually

## 2016-08-31 NOTE — Assessment & Plan Note (Signed)
Iron, cbc

## 2016-08-31 NOTE — Assessment & Plan Note (Signed)
Controlled, stable Continue current dose of medication  

## 2016-11-11 ENCOUNTER — Other Ambulatory Visit: Payer: Self-pay | Admitting: Internal Medicine

## 2016-12-14 ENCOUNTER — Other Ambulatory Visit: Payer: Self-pay | Admitting: Cardiology
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
## 2017-03-06 NOTE — Patient Instructions (Addendum)
  Test(s) ordered today. Your results will be released to MyChart (or called to you) after review, usually within 72hours after test completion. If any changes need to be made, you will be notified at that same time.  All other Health Maintenance issues reviewed.   All recommended immunizations and age-appropriate screenings are up-to-date or discussed.  Flu and prevnar immunizations administered today.   Medications reviewed and updated.  No changes recommended at this time.   Please followup in 6 months

## 2017-03-08 ENCOUNTER — Other Ambulatory Visit (INDEPENDENT_AMBULATORY_CARE_PROVIDER_SITE_OTHER): Payer: Medicare Other

## 2017-03-08 ENCOUNTER — Ambulatory Visit (INDEPENDENT_AMBULATORY_CARE_PROVIDER_SITE_OTHER): Payer: Medicare Other | Admitting: Internal Medicine

## 2017-03-08 ENCOUNTER — Encounter: Payer: Self-pay | Admitting: Internal Medicine

## 2017-03-08 VITALS — BP 162/80 | HR 75 | Temp 98.0°F | Resp 16 | Wt 123.0 lb

## 2017-03-08 DIAGNOSIS — I10 Essential (primary) hypertension: Secondary | ICD-10-CM

## 2017-03-08 DIAGNOSIS — I351 Nonrheumatic aortic (valve) insufficiency: Secondary | ICD-10-CM | POA: Diagnosis not present

## 2017-03-08 DIAGNOSIS — F4323 Adjustment disorder with mixed anxiety and depressed mood: Secondary | ICD-10-CM

## 2017-03-08 DIAGNOSIS — Z23 Encounter for immunization: Secondary | ICD-10-CM

## 2017-03-08 LAB — COMPREHENSIVE METABOLIC PANEL
ALBUMIN: 4.2 g/dL (ref 3.5–5.2)
ALT: 12 U/L (ref 0–35)
AST: 20 U/L (ref 0–37)
Alkaline Phosphatase: 59 U/L (ref 39–117)
BUN: 25 mg/dL — AB (ref 6–23)
CALCIUM: 9.8 mg/dL (ref 8.4–10.5)
CO2: 28 meq/L (ref 19–32)
CREATININE: 1.11 mg/dL (ref 0.40–1.20)
Chloride: 103 mEq/L (ref 96–112)
GFR: 49.26 mL/min — ABNORMAL LOW (ref >60.00)
Glucose, Bld: 97 mg/dL (ref 70–99)
Potassium: 4.5 mEq/L (ref 3.5–5.1)
Sodium: 140 mEq/L (ref 135–145)
TOTAL PROTEIN: 6.8 g/dL (ref 6.0–8.3)
Total Bilirubin: 0.7 mg/dL (ref 0.2–1.2)

## 2017-03-08 NOTE — Assessment & Plan Note (Signed)
Asymptomatic Has appt with Dr Jens Som  Continue current medications

## 2017-03-08 NOTE — Assessment & Plan Note (Signed)
Controlled, stable Continue current dose of medication  

## 2017-03-08 NOTE — Assessment & Plan Note (Signed)
BP elevated here and has been a little variable at home, but overall looks ok on average and she is doing so well I hate to adjust her medications She will continue to monitor her BP and discuss with Dr Jens Som cmp today
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
## 2017-03-12 ENCOUNTER — Other Ambulatory Visit: Payer: Self-pay | Admitting: Cardiology

## 2017-03-16 ENCOUNTER — Encounter: Payer: Self-pay | Admitting: Cardiology

## 2017-03-16 ENCOUNTER — Ambulatory Visit (INDEPENDENT_AMBULATORY_CARE_PROVIDER_SITE_OTHER): Payer: Medicare Other | Admitting: Cardiology

## 2017-03-16 VITALS — BP 156/74 | HR 68 | Ht 67.0 in | Wt 122.0 lb

## 2017-03-16 DIAGNOSIS — I359 Nonrheumatic aortic valve disorder, unspecified: Secondary | ICD-10-CM | POA: Diagnosis not present

## 2017-03-16 DIAGNOSIS — I10 Essential (primary) hypertension: Secondary | ICD-10-CM | POA: Diagnosis not present

## 2017-03-16 MED ORDER — HYDROCHLOROTHIAZIDE 12.5 MG PO CAPS: 12.5000 mg | ORAL_CAPSULE | Freq: Every day | ORAL | 3 refills | Status: DC

## 2017-03-16 NOTE — Patient Instructions (Signed)
Medication Instructions:   START HCTZ 12.5 MG ONCE DAILY  Labwork:  Your physician recommends that you return for lab work in: ONE WEEK  Follow-Up:  Your physician wants you to follow-up in: ONE YEAR WITH DR CRENSHAW You will receive a reminder letter in the mail two months in advance. If you don't receive a letter, please call our office to schedule the follow-up appointment.   If you need a refill on your cardiac medications before your next appointment, please call your pharmacy.    

## 2017-03-31 DIAGNOSIS — I359 Nonrheumatic aortic valve disorder, unspecified: Secondary | ICD-10-CM | POA: Diagnosis not present

## 2017-04-01 LAB — BASIC METABOLIC PANEL
BUN / CREAT RATIO: 26 (ref 12–28)
BUN: 30 mg/dL — ABNORMAL HIGH (ref 8–27)
CHLORIDE: 97 mmol/L (ref 96–106)
CO2: 26 mmol/L (ref 20–29)
Calcium: 9.9 mg/dL (ref 8.7–10.3)
Creatinine, Ser: 1.15 mg/dL — ABNORMAL HIGH (ref 0.57–1.00)
GFR calc non Af Amer: 43 mL/min/{1.73_m2} — ABNORMAL LOW (ref >59)
GFR, EST AFRICAN AMERICAN: 49 mL/min/{1.73_m2} — AB (ref >59)
GLUCOSE: 90 mg/dL (ref 65–99)
POTASSIUM: 4.7 mmol/L (ref 3.5–5.2)
Sodium: 137 mmol/L (ref 134–144)

## 2017-05-06 ENCOUNTER — Other Ambulatory Visit: Payer: Self-pay | Admitting: Cardiology

## 2017-05-06 ENCOUNTER — Other Ambulatory Visit: Payer: Self-pay | Admitting: Internal Medicine

## 2017-05-06 NOTE — Telephone Encounter (Signed)
Rx request sent to pharmacy.  

## 2017-09-01 ENCOUNTER — Telehealth: Payer: Self-pay | Admitting: Emergency Medicine

## 2017-09-01 NOTE — Telephone Encounter (Signed)
Called patient to schedule AWV. Patient stated she would call back at a later date to schedule.

## 2017-09-06 ENCOUNTER — Ambulatory Visit: Payer: Medicare Other | Admitting: Internal Medicine

## 2017-09-21 NOTE — Patient Instructions (Addendum)
  Test(s) ordered today. Your results will be released to MyChart (or called to you) after review, usually within 72hours after test completion. If any changes need to be made, you will be notified at that same time.  Medications reviewed and updated.  No changes recommended at this time.    Please followup in 6 months   
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
## 2017-09-22 ENCOUNTER — Encounter: Payer: Self-pay | Admitting: Internal Medicine

## 2017-09-22 ENCOUNTER — Other Ambulatory Visit (INDEPENDENT_AMBULATORY_CARE_PROVIDER_SITE_OTHER): Payer: Medicare Other

## 2017-09-22 ENCOUNTER — Ambulatory Visit (INDEPENDENT_AMBULATORY_CARE_PROVIDER_SITE_OTHER): Payer: Medicare Other | Admitting: Internal Medicine

## 2017-09-22 VITALS — BP 150/82 | HR 61 | Temp 98.1°F | Resp 16 | Wt 131.0 lb

## 2017-09-22 DIAGNOSIS — I10 Essential (primary) hypertension: Secondary | ICD-10-CM

## 2017-09-22 DIAGNOSIS — I351 Nonrheumatic aortic (valve) insufficiency: Secondary | ICD-10-CM

## 2017-09-22 DIAGNOSIS — F4323 Adjustment disorder with mixed anxiety and depressed mood: Secondary | ICD-10-CM | POA: Diagnosis not present

## 2017-09-22 LAB — COMPREHENSIVE METABOLIC PANEL
ALK PHOS: 63 U/L (ref 39–117)
ALT: 13 U/L (ref 0–35)
AST: 18 U/L (ref 0–37)
Albumin: 4.3 g/dL (ref 3.5–5.2)
BILIRUBIN TOTAL: 0.6 mg/dL (ref 0.2–1.2)
BUN: 30 mg/dL — ABNORMAL HIGH (ref 6–23)
CO2: 30 meq/L (ref 19–32)
CREATININE: 1.26 mg/dL — AB (ref 0.40–1.20)
Calcium: 9.8 mg/dL (ref 8.4–10.5)
Chloride: 95 mEq/L — ABNORMAL LOW (ref 96–112)
GFR: 42.5 mL/min — AB (ref >60.00)
GLUCOSE: 89 mg/dL (ref 70–99)
Potassium: 4.5 mEq/L (ref 3.5–5.1)
Sodium: 132 mEq/L — ABNORMAL LOW (ref 135–145)
Total Protein: 6.8 g/dL (ref 6.0–8.3)

## 2017-09-22 LAB — CBC WITH DIFFERENTIAL/PLATELET
BASOS ABS: 0.1 10*3/uL (ref 0.0–0.1)
Basophils Relative: 0.9 % (ref 0.0–3.0)
EOS ABS: 0 10*3/uL (ref 0.0–0.7)
Eosinophils Relative: 0.2 % (ref 0.0–5.0)
HEMATOCRIT: 40.5 % (ref 36.0–46.0)
Hemoglobin: 14 g/dL (ref 12.0–15.0)
LYMPHS ABS: 1.6 10*3/uL (ref 0.7–4.0)
LYMPHS PCT: 22.5 % (ref 12.0–46.0)
MCHC: 34.6 g/dL (ref 30.0–36.0)
MCV: 83.6 fl (ref 78.0–100.0)
MONOS PCT: 8.9 % (ref 3.0–12.0)
Monocytes Absolute: 0.6 10*3/uL (ref 0.1–1.0)
NEUTROS PCT: 67.5 % (ref 43.0–77.0)
Neutro Abs: 4.7 10*3/uL (ref 1.4–7.7)
PLATELETS: 225 10*3/uL (ref 150.0–400.0)
RBC: 4.84 Mil/uL (ref 3.87–5.11)
RDW: 13.2 % (ref 11.5–15.5)
WBC: 6.9 10*3/uL (ref 4.0–10.5)

## 2017-09-22 NOTE — Assessment & Plan Note (Signed)
Asymptomatic - no sob, chest pain, lightheadedness Continue current medications Will f/u with Dr Jens Som

## 2017-09-22 NOTE — Assessment & Plan Note (Signed)
Controlled, stable Continue current dose of medication - remeron

## 2017-09-22 NOTE — Assessment & Plan Note (Signed)
bp checked at [facility] - variable - has it checked weekly and will bring in her weekly measures instead of the monthly measures Continue current medications at current dose Cmp, cbc today

## 2017-10-05 ENCOUNTER — Other Ambulatory Visit: Payer: Self-pay | Admitting: Cardiology

## 2017-10-05 NOTE — Telephone Encounter (Signed)
Rx sent to pharmacy   

## 2017-10-24 ENCOUNTER — Other Ambulatory Visit: Payer: Self-pay | Admitting: Cardiology

## 2017-10-24 ENCOUNTER — Other Ambulatory Visit: Payer: Self-pay | Admitting: Internal Medicine

## 2017-10-26 NOTE — Telephone Encounter (Signed)
Rx sent to pharmacy   

## 2017-11-15 ENCOUNTER — Inpatient Hospital Stay (HOSPITAL_COMMUNITY)
Admission: EM | Admit: 2017-11-15 | Discharge: 2017-11-19 | DRG: 684 | Disposition: A | Discharge status: Skilled Nursing Facility | Payer: Medicare Other | Attending: Internal Medicine | Admitting: Internal Medicine

## 2017-11-15 DIAGNOSIS — Z79899 Other long term (current) drug therapy: Secondary | ICD-10-CM

## 2017-11-15 DIAGNOSIS — E860 Dehydration: Secondary | ICD-10-CM | POA: Diagnosis not present

## 2017-11-15 DIAGNOSIS — R195 Other fecal abnormalities: Secondary | ICD-10-CM

## 2017-11-15 DIAGNOSIS — N179 Acute kidney failure, unspecified: Principal | ICD-10-CM | POA: Diagnosis present

## 2017-11-15 DIAGNOSIS — Z8619 Personal history of other infectious and parasitic diseases: Secondary | ICD-10-CM

## 2017-11-15 DIAGNOSIS — R748 Abnormal levels of other serum enzymes: Secondary | ICD-10-CM | POA: Diagnosis not present

## 2017-11-15 DIAGNOSIS — I440 Atrioventricular block, first degree: Secondary | ICD-10-CM | POA: Diagnosis not present

## 2017-11-15 DIAGNOSIS — A084 Viral intestinal infection, unspecified: Secondary | ICD-10-CM | POA: Diagnosis not present

## 2017-11-15 DIAGNOSIS — R112 Nausea with vomiting, unspecified: Secondary | ICD-10-CM

## 2017-11-15 DIAGNOSIS — D649 Anemia, unspecified: Secondary | ICD-10-CM | POA: Diagnosis present

## 2017-11-15 DIAGNOSIS — R197 Diarrhea, unspecified: Secondary | ICD-10-CM

## 2017-11-15 DIAGNOSIS — Z8249 Family history of ischemic heart disease and other diseases of the circulatory system: Secondary | ICD-10-CM

## 2017-11-15 DIAGNOSIS — R55 Syncope and collapse: Secondary | ICD-10-CM | POA: Diagnosis not present

## 2017-11-15 DIAGNOSIS — I10 Essential (primary) hypertension: Secondary | ICD-10-CM | POA: Diagnosis not present

## 2017-11-15 DIAGNOSIS — I959 Hypotension, unspecified: Secondary | ICD-10-CM | POA: Diagnosis not present

## 2017-11-15 DIAGNOSIS — I951 Orthostatic hypotension: Secondary | ICD-10-CM | POA: Diagnosis present

## 2017-11-15 DIAGNOSIS — I351 Nonrheumatic aortic (valve) insufficiency: Secondary | ICD-10-CM | POA: Diagnosis present

## 2017-11-15 DIAGNOSIS — Z87891 Personal history of nicotine dependence: Secondary | ICD-10-CM

## 2017-11-15 DIAGNOSIS — Z881 Allergy status to other antibiotic agents status: Secondary | ICD-10-CM

## 2017-11-15 NOTE — ED Triage Notes (Signed)
Pt BIB [facility]. Pt has been c/o N/V/D for 10 days. Tonight she had a near syncopal episode. Pt did not suffer a fall or trauma. EMS reports orthostatics are positive. No vomiting en route but pt has had multiple instances of diarrhea tonight.

## 2017-11-15 NOTE — ED Notes (Signed)
Bed: WA07 Expected date:  Expected time:  Means of arrival:  Comments: Elderly; N/V
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
## 2017-11-16 ENCOUNTER — Encounter (HOSPITAL_COMMUNITY): Payer: Self-pay

## 2017-11-16 ENCOUNTER — Ambulatory Visit: Payer: Medicare Other | Admitting: Internal Medicine

## 2017-11-16 ENCOUNTER — Other Ambulatory Visit: Payer: Self-pay

## 2017-11-16 DIAGNOSIS — N179 Acute kidney failure, unspecified: Secondary | ICD-10-CM | POA: Diagnosis not present

## 2017-11-16 DIAGNOSIS — Z87891 Personal history of nicotine dependence: Secondary | ICD-10-CM | POA: Diagnosis not present

## 2017-11-16 DIAGNOSIS — E860 Dehydration: Secondary | ICD-10-CM | POA: Diagnosis present

## 2017-11-16 DIAGNOSIS — I10 Essential (primary) hypertension: Secondary | ICD-10-CM | POA: Diagnosis not present

## 2017-11-16 DIAGNOSIS — R112 Nausea with vomiting, unspecified: Secondary | ICD-10-CM

## 2017-11-16 DIAGNOSIS — R197 Diarrhea, unspecified: Secondary | ICD-10-CM

## 2017-11-16 DIAGNOSIS — R748 Abnormal levels of other serum enzymes: Secondary | ICD-10-CM | POA: Diagnosis not present

## 2017-11-16 DIAGNOSIS — I351 Nonrheumatic aortic (valve) insufficiency: Secondary | ICD-10-CM | POA: Diagnosis present

## 2017-11-16 DIAGNOSIS — Z881 Allergy status to other antibiotic agents status: Secondary | ICD-10-CM | POA: Diagnosis not present

## 2017-11-16 DIAGNOSIS — R279 Unspecified lack of coordination: Secondary | ICD-10-CM | POA: Diagnosis not present

## 2017-11-16 DIAGNOSIS — Z8249 Family history of ischemic heart disease and other diseases of the circulatory system: Secondary | ICD-10-CM | POA: Diagnosis not present

## 2017-11-16 DIAGNOSIS — R41841 Cognitive communication deficit: Secondary | ICD-10-CM | POA: Diagnosis not present

## 2017-11-16 DIAGNOSIS — R278 Other lack of coordination: Secondary | ICD-10-CM | POA: Diagnosis not present

## 2017-11-16 DIAGNOSIS — Z79899 Other long term (current) drug therapy: Secondary | ICD-10-CM | POA: Diagnosis not present

## 2017-11-16 DIAGNOSIS — Z743 Need for continuous supervision: Secondary | ICD-10-CM | POA: Diagnosis not present

## 2017-11-16 DIAGNOSIS — M6281 Muscle weakness (generalized): Secondary | ICD-10-CM | POA: Diagnosis not present

## 2017-11-16 DIAGNOSIS — I951 Orthostatic hypotension: Secondary | ICD-10-CM | POA: Diagnosis present

## 2017-11-16 DIAGNOSIS — R2689 Other abnormalities of gait and mobility: Secondary | ICD-10-CM | POA: Diagnosis not present

## 2017-11-16 DIAGNOSIS — A084 Viral intestinal infection, unspecified: Secondary | ICD-10-CM | POA: Diagnosis present

## 2017-11-16 DIAGNOSIS — D649 Anemia, unspecified: Secondary | ICD-10-CM | POA: Diagnosis present

## 2017-11-16 DIAGNOSIS — Z8619 Personal history of other infectious and parasitic diseases: Secondary | ICD-10-CM | POA: Diagnosis not present

## 2017-11-16 LAB — COMPREHENSIVE METABOLIC PANEL
ALBUMIN: 4 g/dL (ref 3.5–5.0)
ALK PHOS: 114 U/L (ref 38–126)
ALT: 18 U/L (ref 0–44)
AST: 36 U/L (ref 15–41)
Anion gap: 13 (ref 5–15)
BILIRUBIN TOTAL: 1.3 mg/dL — AB (ref 0.3–1.2)
BUN: 99 mg/dL — AB (ref 8–23)
CALCIUM: 9.6 mg/dL (ref 8.9–10.3)
CO2: 24 mmol/L (ref 22–32)
Chloride: 103 mmol/L (ref 98–111)
Creatinine, Ser: 3.01 mg/dL — ABNORMAL HIGH (ref 0.44–1.00)
GFR calc Af Amer: 15 mL/min — ABNORMAL LOW (ref >60)
GFR calc non Af Amer: 13 mL/min — ABNORMAL LOW (ref >60)
GLUCOSE: 90 mg/dL (ref 70–99)
Potassium: 3.9 mmol/L (ref 3.5–5.1)
SODIUM: 140 mmol/L (ref 135–145)
TOTAL PROTEIN: 7 g/dL (ref 6.5–8.1)

## 2017-11-16 LAB — CBC WITH DIFFERENTIAL/PLATELET
BASOS ABS: 0 10*3/uL (ref 0.0–0.1)
Basophils Relative: 0 %
EOS ABS: 0 10*3/uL (ref 0.0–0.7)
EOS PCT: 0 %
HCT: 44.8 % (ref 36.0–46.0)
Hemoglobin: 15.6 g/dL — ABNORMAL HIGH (ref 12.0–15.0)
LYMPHS PCT: 14 %
Lymphs Abs: 1.8 10*3/uL (ref 0.7–4.0)
MCH: 28.9 pg (ref 26.0–34.0)
MCHC: 34.8 g/dL (ref 30.0–36.0)
MCV: 83 fL (ref 78.0–100.0)
MONO ABS: 0.9 10*3/uL (ref 0.1–1.0)
Monocytes Relative: 7 %
Neutro Abs: 9.8 10*3/uL — ABNORMAL HIGH (ref 1.7–7.7)
Neutrophils Relative %: 79 %
PLATELETS: 228 10*3/uL (ref 150–400)
RBC: 5.4 MIL/uL — AB (ref 3.87–5.11)
RDW: 13.5 % (ref 11.5–15.5)
WBC: 12.5 10*3/uL — AB (ref 4.0–10.5)

## 2017-11-16 LAB — URINALYSIS, ROUTINE W REFLEX MICROSCOPIC
Bilirubin Urine: NEGATIVE
Glucose, UA: NEGATIVE mg/dL
Ketones, ur: NEGATIVE mg/dL
NITRITE: NEGATIVE
Protein, ur: NEGATIVE mg/dL
Specific Gravity, Urine: 1.013 (ref 1.005–1.030)
pH: 8 (ref 5.0–8.0)

## 2017-11-16 LAB — LIPASE, BLOOD: Lipase: 62 U/L — ABNORMAL HIGH (ref 11–51)

## 2017-11-16 LAB — POC OCCULT BLOOD, ED: FECAL OCCULT BLD: POSITIVE — AB

## 2017-11-16 LAB — MRSA PCR SCREENING: MRSA BY PCR: NEGATIVE

## 2017-11-16 MED ORDER — MIRTAZAPINE 15 MG PO TABS
15.0000 mg | ORAL_TABLET | Freq: Every day | ORAL | Status: DC
  Administered 2017-11-16 – 2017-11-18 (×3): 15 mg via ORAL
  Filled 2017-11-16 (×3): qty 1

## 2017-11-16 MED ORDER — SODIUM CHLORIDE 0.9 % IV BOLUS
1000.0000 mL | Freq: Once | INTRAVENOUS | Status: AC
  Administered 2017-11-16: 1000 mL via INTRAVENOUS

## 2017-11-16 MED ORDER — ACETAMINOPHEN 650 MG RE SUPP: 650.0000 mg | Freq: Four times a day (QID) | RECTAL | Status: DC | PRN

## 2017-11-16 MED ORDER — ORAL CARE MOUTH RINSE
15.0000 mL | Freq: Two times a day (BID) | OROMUCOSAL | Status: DC
Start: 2017-11-16 — End: 2017-11-19
  Administered 2017-11-16 – 2017-11-19 (×3): 15 mL via OROMUCOSAL

## 2017-11-16 MED ORDER — LACTATED RINGERS IV SOLN
INTRAVENOUS | Status: DC
  Administered 2017-11-16 – 2017-11-19 (×5): via INTRAVENOUS

## 2017-11-16 MED ORDER — HEPARIN SODIUM (PORCINE) 5000 UNIT/ML IJ SOLN
5000.0000 [IU] | Freq: Three times a day (TID) | INTRAMUSCULAR | Status: DC
  Administered 2017-11-16 – 2017-11-17 (×3): 5000 [IU] via SUBCUTANEOUS
  Filled 2017-11-16 (×3): qty 1

## 2017-11-16 MED ORDER — CHLORHEXIDINE GLUCONATE 0.12 % MT SOLN
15.0000 mL | Freq: Two times a day (BID) | OROMUCOSAL | Status: DC
  Administered 2017-11-16 – 2017-11-18 (×3): 15 mL via OROMUCOSAL
  Filled 2017-11-16 (×4): qty 15

## 2017-11-16 MED ORDER — ACETAMINOPHEN 325 MG PO TABS: 650.0000 mg | ORAL_TABLET | Freq: Four times a day (QID) | ORAL | Status: DC | PRN

## 2017-11-16 NOTE — ED Notes (Signed)
Pt became dizzy and disoriented upon sitting. Standing was not attempted. Positive for orthostatics.

## 2017-11-16 NOTE — ED Notes (Signed)
ED TO INPATIENT HANDOFF REPORT  Name/Age/Gender Olivia Gay 84 y.o. female  Code Status Code Status History    Date Active Date Inactive Code Status Order ID Comments User Context   06/18/2012 0535 06/23/2012 1942 Full Code [id number]  Hitt, Tobie Lords, RN Inpatient      Home/SNF/Other Nursing Home  Chief Complaint N/V/D  Level of Care/Admitting Diagnosis ED Disposition    ED Disposition Condition [facility] Area: [facility] [[id number]]  Level of Care: Telemetry [5]  Admit to tele based on following criteria: Eval of Syncope  Diagnosis: AKI (acute kidney injury) [facility]) [291916]  Admitting Physician: Dessa Phi [phone]  Attending Physician: Dessa Phi [phone]  Estimated length of stay: past midnight tomorrow  Certification:: I certify this patient will need inpatient services for at least 2 midnights  PT Class (Do Not Modify): Inpatient [101]  PT Acc Code (Do Not Modify): Private [1]       Medical History Past Medical History:  Diagnosis Date  . Anemia   . Aortic insufficiency    mild-mod, Echo 12/11and 4/13  . Hypertension   . Migraines   . Polio 84 years old  . Shingles 05/2012    Allergies Allergies  Allergen Reactions  . Ceftin [Cefuroxime Axetil]     diarrhea    IV Location/Drains/Wounds Patient Lines/Drains/Airways Status   Active Line/Drains/Airways    Name:   Placement date:   Placement time:   Site:   Days:   Peripheral IV 11/15/17 Left Antecubital   11/15/17    -    Antecubital   1   Wound 06/18/12 Abrasion(s) Knee Right   06/18/12    0515    Knee   1977          Labs/Imaging Results for orders placed or performed during the hospital encounter of 11/15/17 (from the past 48 hour(s))  POC occult blood, ED Provider will collect     Status: Abnormal   Collection Time: 11/16/17 12:48 AM  Result Value Ref Range   Fecal Occult Bld POSITIVE (A) NEGATIVE  CBC with Differential/Platelet     Status:  Abnormal   Collection Time: 11/16/17 12:52 AM  Result Value Ref Range   WBC 12.5 (H) 4.0 - 10.5 K/uL   RBC 5.40 (H) 3.87 - 5.11 MIL/uL   Hemoglobin 15.6 (H) 12.0 - 15.0 g/dL   HCT 44.8 36.0 - 46.0 %   MCV 83.0 78.0 - 100.0 fL   MCH 28.9 26.0 - 34.0 pg   MCHC 34.8 30.0 - 36.0 g/dL   RDW 13.5 11.5 - 15.5 %   Platelets 228 150 - 400 K/uL   Neutrophils Relative % 79 %   Neutro Abs 9.8 (H) 1.7 - 7.7 K/uL   Lymphocytes Relative 14 %   Lymphs Abs 1.8 0.7 - 4.0 K/uL   Monocytes Relative 7 %   Monocytes Absolute 0.9 0.1 - 1.0 K/uL   Eosinophils Relative 0 %   Eosinophils Absolute 0.0 0.0 - 0.7 K/uL   Basophils Relative 0 %   Basophils Absolute 0.0 0.0 - 0.1 K/uL    Comment: Performed at [facility], [place] [address] [place], [place] [address]  Comprehensive metabolic panel     Status: Abnormal   Collection Time: 11/16/17 12:52 AM  Result Value Ref Range   Sodium 140 135 - 145 mmol/L   Potassium 3.9 3.5 - 5.1 mmol/L    Comment: SLIGHT HEMOLYSIS   Chloride 103  98 - 111 mmol/L   CO2 24 22 - 32 mmol/L   Glucose, Bld 90 70 - 99 mg/dL   BUN 99 (H) 8 - 23 mg/dL   Creatinine, Ser 3.01 (H) 0.44 - 1.00 mg/dL   Calcium 9.6 8.9 - 10.3 mg/dL   Total Protein 7.0 6.5 - 8.1 g/dL   Albumin 4.0 3.5 - 5.0 g/dL   AST 36 15 - 41 U/L   ALT 18 0 - 44 U/L   Alkaline Phosphatase 114 38 - 126 U/L   Total Bilirubin 1.3 (H) 0.3 - 1.2 mg/dL   GFR calc non Af Amer 13 (L) >60 mL/min   GFR calc Af Amer 15 (L) >60 mL/min    Comment: (NOTE) The eGFR has been calculated using the CKD EPI equation. This calculation has not been validated in all clinical situations. eGFR's persistently <60 mL/min signify possible Chronic Kidney Disease.    Anion gap 13 5 - 15    Comment: Performed at [facility], [place] [address], [place], [place] [address]  Lipase, blood     Status: Abnormal   Collection Time: 11/16/17 12:52 AM  Result Value Ref Range   Lipase 62 (H) 11 - 51 U/L     Comment: Performed at [facility], [place] [address] [place], [place] [address]   No results found.  Pending Labs Unresulted Labs (From admission, onward)   Start     Ordered   11/16/17 0024  C difficile quick scan w PCR reflex  (C Difficile quick screen w PCR reflex panel)  Once, for 24 hours,   R     11/16/17 0024   11/16/17 0024  Gastrointestinal Panel by PCR , Stool  (Gastrointestinal Panel by PCR, Stool)  Once,   R     11/16/17 0024   11/16/17 0023  Urinalysis, Routine w reflex microscopic  Once,   R     11/16/17 0022      Vitals/Pain Today's Vitals   11/16/17 0615 11/16/17 0635 11/16/17 0638 11/16/17 0701  BP: (!) 129/59   (!) 146/60  Pulse: 63   69  Resp: 15   15  Temp:  97.9 F (36.6 C)    TempSrc:  Oral    SpO2: 100%   100%  Weight:   128 lb (58.1 kg)   Height:   5' 7"  (1.702 m)   PainSc:        Isolation Precautions No active isolations  Medications Medications  sodium chloride 0.9 % bolus 1,000 mL (1,000 mLs Intravenous New Bag/Given 11/16/17 0705)  sodium chloride 0.9 % bolus 1,000 mL (0 mLs Intravenous Stopped 11/16/17 0251)  sodium chloride 0.9 % bolus 1,000 mL (0 mLs Intravenous Stopped 11/16/17 0629)    Mobility walks with person assist

## 2017-11-16 NOTE — Progress Notes (Signed)
CSW consulted that patient is from [facility]. Per [facility] Nurse Toniann FailWendy, patient is from independent living. CSW signing off, patient is from [facility]. Please reconsult if new needs arise.   Celso SickleKimberly Jaimie Pippins, [place] Clinical Social Worker [facility] Cell#: [phone]

## 2017-11-16 NOTE — ED Notes (Signed)
Bed: WA21 Expected date:  Expected time:  Means of arrival:  Comments: RM 7
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
## 2017-11-16 NOTE — ED Notes (Signed)
Placed a pure wick on patient  

## 2017-11-17 DIAGNOSIS — N179 Acute kidney failure, unspecified: Principal | ICD-10-CM

## 2017-11-17 LAB — BASIC METABOLIC PANEL
ANION GAP: 7 (ref 5–15)
BUN: 54 mg/dL — ABNORMAL HIGH (ref 8–23)
CALCIUM: 8.4 mg/dL — AB (ref 8.9–10.3)
CO2: 23 mmol/L (ref 22–32)
Chloride: 111 mmol/L (ref 98–111)
Creatinine, Ser: 1.66 mg/dL — ABNORMAL HIGH (ref 0.44–1.00)
GFR, EST AFRICAN AMERICAN: 30 mL/min — AB (ref >60)
GFR, EST NON AFRICAN AMERICAN: 26 mL/min — AB (ref >60)
Glucose, Bld: 89 mg/dL (ref 70–99)
POTASSIUM: 3 mmol/L — AB (ref 3.5–5.1)
SODIUM: 141 mmol/L (ref 135–145)

## 2017-11-17 LAB — CBC
HEMATOCRIT: 30.9 % — AB (ref 36.0–46.0)
HEMOGLOBIN: 10.7 g/dL — AB (ref 12.0–15.0)
MCH: 28.5 pg (ref 26.0–34.0)
MCHC: 34.6 g/dL (ref 30.0–36.0)
MCV: 82.4 fL (ref 78.0–100.0)
Platelets: 154 10*3/uL (ref 150–400)
RBC: 3.75 MIL/uL — AB (ref 3.87–5.11)
RDW: 13.5 % (ref 11.5–15.5)
WBC: 6.4 10*3/uL (ref 4.0–10.5)

## 2017-11-17 LAB — LIPASE, BLOOD: LIPASE: 49 U/L (ref 11–51)

## 2017-11-17 MED ORDER — KETOROLAC TROMETHAMINE 15 MG/ML IJ SOLN: 15.0000 mg | Freq: Once | INTRAMUSCULAR | Status: DC

## 2017-11-17 MED ORDER — MUSCLE RUB 10-15 % EX CREA
TOPICAL_CREAM | CUTANEOUS | Status: DC | PRN
  Filled 2017-11-17: qty 85

## 2017-11-17 MED ORDER — POTASSIUM CHLORIDE 10 MEQ/100ML IV SOLN
10.0000 meq | INTRAVENOUS | Status: AC
  Administered 2017-11-17 (×4): 10 meq via INTRAVENOUS
  Filled 2017-11-17 (×5): qty 100

## 2017-11-17 MED ORDER — METOPROLOL TARTRATE 25 MG PO TABS
12.5000 mg | ORAL_TABLET | Freq: Two times a day (BID) | ORAL | Status: DC
  Administered 2017-11-17 (×2): 12.5 mg via ORAL
  Filled 2017-11-17 (×2): qty 1

## 2017-11-17 MED ORDER — HYDRALAZINE HCL 20 MG/ML IJ SOLN
10.0000 mg | Freq: Once | INTRAMUSCULAR | Status: AC
Start: 2017-11-18 — End: 2017-11-18
  Administered 2017-11-18: 10 mg via INTRAVENOUS
  Filled 2017-11-17: qty 1

## 2017-11-17 NOTE — Progress Notes (Signed)
PROGRESS NOTE    Olivia Gay  ZOX:[id number] DOB: [birth year] DOA: 11/15/2017 PCP: Pincus Sanes, MD   Brief Narrative:  84 year old female with history of essential hypertension was sent to the hospital for evaluation of nausea, vomiting, diarrhea and near syncope episode.  Patient denied any specific complaints.  By the time she came to the ER her symptoms had improved but on the labs she was noted to have elevated creatinine of 3.0 which is up from baseline of 1.2.  She was admitted to the hospital for IV hydration and monitoring.  She received IV fluids and felt better but her hemoglobin trended down and therefore gastroenterology was consulted.  She was Hemoccult positive.   Assessment & Plan:   Principal Problem:   AKI (acute kidney injury) (HCC) Active Problems:   Benign essential HTN   Nausea vomiting and diarrhea   Elevated lipase  Acute kidney injury Moderate to severe dehydration - Initial admission creatinine was 3.0.  Baseline creatinine is 1.2.  This is trended down with IV fluids - Continue to avoid nephrotoxic drugs and continue IV fluids -Monitor urine output -Provide supportive care.  We will continue to trend creatinine  Anemia - Hemoglobin has trended down to 10.7 which is lower from baseline around 14.0.  This is likely a combination of hemodilution from IV fluids versus occult bleeding.  She is Hemoccult positive -Consulted gastroenterology-awaiting input -Provide supportive care  Nausea, vomiting and nonbloody diarrhea -Does not appear to have any of the symptoms while in the hospital.  Will closely monitor this year  Essential hypertension - Due to above-mentioned reason her enalapril and hydrochlorothiazide are on hold.  Can resume metoprolol.  DVT prophylaxis: Stop subcutaneous heparin, will place SCDs Code Status: Full code Family Communication: None at bedside Disposition Plan: Likely discharge in next 24-48 hours if remains  stable  Consultants:   Gastroenterology  Procedures:   None  Antimicrobials:   None   Subjective: Patient does not have any complaints.  No acute events overnight.  Review of Systems Otherwise negative except as per HPI, including: General: Denies fever, chills, night sweats or unintended weight loss. Resp: Denies cough, wheezing, shortness of breath. Cardiac: Denies chest pain, palpitations, orthopnea, paroxysmal nocturnal dyspnea. GI: Denies abdominal pain, nausea, vomiting, diarrhea or constipation GU: Denies dysuria, frequency, hesitancy or incontinence MS: Denies muscle aches, joint pain or swelling Neuro: Denies headache, neurologic deficits (focal weakness, numbness, tingling), abnormal gait Psych: Denies anxiety, depression, SI/HI/AVH Skin: Denies new rashes or lesions ID: Denies sick contacts, exotic exposures, travel  Objective: Vitals:   11/16/17 1335 11/16/17 2207 11/17/17 0452 11/17/17 1158  BP: 116/72 (!) 113/54 (!) 148/58 (!) 123/56  Pulse: 83 71 70 78  Resp: 18 18 20    Temp: 98.3 F (36.8 C) 99.1 F (37.3 C) 99 F (37.2 C)   TempSrc: Oral Oral Oral   SpO2: 100% 100% 99%   Weight:      Height:        Intake/Output Summary (Last 24 hours) at 11/17/2017 1248 Last data filed at 11/17/2017 0431 Gross per 24 hour  Intake 2615 ml  Output 650 ml  Net 1965 ml   Filed Weights   11/16/17 0638  Weight: 58.1 kg (128 lb)    Examination:  General exam: Appears calm and comfortable, elderly frail-appearing, dry mucous membrane Respiratory system: Clear to auscultation. Respiratory effort normal. Cardiovascular system: S1 & S2 heard, RRR. No JVD, murmurs, rubs, gallops or clicks. No pedal edema. Gastrointestinal system:  Abdomen is nondistended, soft and nontender. No organomegaly or masses felt. Normal bowel sounds heard. Central nervous system: Alert and oriented. No focal neurological deficits. Extremities: Symmetric 4 x 5 power. Skin: No rashes,  lesions or ulcers Psychiatry: Judgement and insight appear normal. Mood & affect appropriate.     Data Reviewed:   CBC: Recent Labs  Lab 11/16/17 0052 11/17/17 0421  WBC 12.5* 6.4  NEUTROABS 9.8*  --   HGB 15.6* 10.7*  HCT 44.8 30.9*  MCV 83.0 82.4  PLT 228 154   Basic Metabolic Panel: Recent Labs  Lab 11/16/17 0052 11/17/17 0421  NA 140 141  K 3.9 3.0*  CL 103 111  CO2 24 23  GLUCOSE 90 89  BUN 99* 54*  CREATININE 3.01* 1.66*  CALCIUM 9.6 8.4*   GFR: Estimated Creatinine Clearance: 21.1 mL/min (A) (by C-G formula based on SCr of 1.66 mg/dL (H)). Liver Function Tests: Recent Labs  Lab 11/16/17 0052  AST 36  ALT 18  ALKPHOS 114  BILITOT 1.3*  PROT 7.0  ALBUMIN 4.0   Recent Labs  Lab 11/16/17 0052 11/17/17 0421  LIPASE 62* 49   No results for input(s): AMMONIA in the last 168 hours. Coagulation Profile: No results for input(s): INR, PROTIME in the last 168 hours. Cardiac Enzymes: No results for input(s): CKTOTAL, CKMB, CKMBINDEX, TROPONINI in the last 168 hours. BNP (last 3 results) No results for input(s): PROBNP in the last 8760 hours. HbA1C: No results for input(s): HGBA1C in the last 72 hours. CBG: No results for input(s): GLUCAP in the last 168 hours. Lipid Profile: No results for input(s): CHOL, HDL, LDLCALC, TRIG, CHOLHDL, LDLDIRECT in the last 72 hours. Thyroid Function Tests: No results for input(s): TSH, T4TOTAL, FREET4, T3FREE, THYROIDAB in the last 72 hours. Anemia Panel: No results for input(s): VITAMINB12, FOLATE, FERRITIN, TIBC, IRON, RETICCTPCT in the last 72 hours. Sepsis Labs: No results for input(s): PROCALCITON, LATICACIDVEN in the last 168 hours.  Recent Results (from the past 240 hour(s))  MRSA PCR Screening     Status: None   Collection Time: 11/16/17 10:20 AM  Result Value Ref Range Status   MRSA by PCR NEGATIVE NEGATIVE Final    Comment:        The GeneXpert MRSA Assay (FDA approved for NASAL specimens only), is  one component of a comprehensive MRSA colonization surveillance program. It is not intended to diagnose MRSA infection nor to guide or monitor treatment for MRSA infections. Performed at [facility], [address] [place], [place] [address]          Radiology Studies: No results found.      Scheduled Meds: . chlorhexidine  15 mL Mouth Rinse BID  . heparin  5,000 Units Subcutaneous Q8H  . mouth rinse  15 mL Mouth Rinse q12n4p  . metoprolol tartrate  12.5 mg Oral BID  . mirtazapine  15 mg Oral QHS   Continuous Infusions: . lactated ringers 75 mL/hr at 11/17/17 1151  . potassium chloride 10 mEq (11/17/17 1151)     LOS: 1 day    I have spent 35 minutes face to face with the patient and on the ward discussing the patients care, assessment, plan and disposition with other care givers. >50% of the time was devoted counseling the patient about the risks and benefits of treatment and coordinating care.     Ankit Joline Maxcyhirag Amin, MD [facility] Pager [phone]   If 7PM-7AM, please contact night-coverage [email or web address] Password [email or web address] 11/17/2017,  12:48 PM

## 2017-11-18 LAB — CBC
HCT: 31.7 % — ABNORMAL LOW (ref 36.0–46.0)
HEMOGLOBIN: 10.8 g/dL — AB (ref 12.0–15.0)
MCH: 28.1 pg (ref 26.0–34.0)
MCHC: 34.1 g/dL (ref 30.0–36.0)
MCV: 82.6 fL (ref 78.0–100.0)
Platelets: 162 10*3/uL (ref 150–400)
RBC: 3.84 MIL/uL — AB (ref 3.87–5.11)
RDW: 13.3 % (ref 11.5–15.5)
WBC: 6.4 10*3/uL (ref 4.0–10.5)

## 2017-11-18 LAB — MAGNESIUM: Magnesium: 1.3 mg/dL — ABNORMAL LOW (ref 1.7–2.4)

## 2017-11-18 LAB — BASIC METABOLIC PANEL
ANION GAP: 6 (ref 5–15)
BUN: 36 mg/dL — AB (ref 8–23)
CALCIUM: 8.4 mg/dL — AB (ref 8.9–10.3)
CHLORIDE: 110 mmol/L (ref 98–111)
CO2: 23 mmol/L (ref 22–32)
CREATININE: 1.26 mg/dL — AB (ref 0.44–1.00)
GFR calc Af Amer: 42 mL/min — ABNORMAL LOW (ref >60)
GFR, EST NON AFRICAN AMERICAN: 37 mL/min — AB (ref >60)
Glucose, Bld: 106 mg/dL — ABNORMAL HIGH (ref 70–99)
POTASSIUM: 3.9 mmol/L (ref 3.5–5.1)
SODIUM: 139 mmol/L (ref 135–145)

## 2017-11-18 MED ORDER — MAGNESIUM SULFATE 2 GM/50ML IV SOLN
2.0000 g | Freq: Once | INTRAVENOUS | Status: AC
Start: 2017-11-18 — End: 2017-11-18
  Administered 2017-11-18: 2 g via INTRAVENOUS
  Filled 2017-11-18: qty 50

## 2017-11-18 MED ORDER — METOPROLOL TARTRATE 25 MG PO TABS
25.0000 mg | ORAL_TABLET | Freq: Two times a day (BID) | ORAL | Status: DC
  Administered 2017-11-18 – 2017-11-19 (×3): 25 mg via ORAL
  Filled 2017-11-18 (×3): qty 1

## 2017-11-18 MED ORDER — CLONIDINE HCL 0.1 MG PO TABS
0.1000 mg | ORAL_TABLET | Freq: Once | ORAL | Status: AC
  Administered 2017-11-18: 0.1 mg via ORAL
  Filled 2017-11-18: qty 1

## 2017-11-18 MED ORDER — AMLODIPINE BESYLATE 5 MG PO TABS
5.0000 mg | ORAL_TABLET | Freq: Every day | ORAL | Status: DC
  Administered 2017-11-18 – 2017-11-19 (×2): 5 mg via ORAL
  Filled 2017-11-18 (×2): qty 1

## 2017-11-18 NOTE — Discharge Summary (Addendum)
Physician Discharge Summary  Olivia Gay ZOX:[id number] DOB: May 24, 1927 DOA: 11/15/2017  PCP: Pincus Sanes, MD  Admit date: 11/15/2017 Discharge date: 11/19/17  Admitted From: independent living Disposition:  SNF  Recommendations for Outpatient Follow-up:  1. Follow up with PCP in 1-2 weeks- may need to change outpatient blood pressure regimen.  2. Please obtain BMP/CBC in one week your next doctors visit.  3. Follow-up outpatient with gastroenterology in 3-4 weeks if needed  Discharge Condition: Stable CODE STATUS: Full  Diet recommendation: 2g Na diet.   Brief/Interim Summary: 84 year old female with history of essential hypertension was sent to the hospital for evaluation of nausea, vomiting, diarrhea and near syncope episode.  Patient denied any specific complaints.  By the time she came to the ER her symptoms had improved but on the labs she was noted to have elevated creatinine of 3.0 which is up from baseline of 1.2.  She was admitted to the hospital for IV hydration and monitoring.  She received IV fluids and felt better but her hemoglobin trended down and therefore gastroenterology was consulted.  She was Hemoccult positive but did not have any further episodes while in the hospital.  She remained hemodynamically stable.  After receiving IV fluid her hemoglobin dropped close to 10 but remained stable.  No further bowel movements in the hospital.  Patient is reluctant to get any endoscopic procedures at this time especially if not emergently necessary. Due to her weakness she was evaluated by physical therapy as well while in the hospital; who recommended skilled nursing facility  After receiving aggressive IV fluids her creatinine trended down from 3.0 to baseline of 1.2. At this point she is reached maximum benefit from an hospital stay and stable to be discharged with outpatient follow-up recommendations as stated above.   Discharge Diagnoses:  Principal Problem:   AKI  (acute kidney injury) (HCC) Active Problems:   Benign essential HTN   Nausea vomiting and diarrhea   Elevated lipase   Acute kidney injury; resolved  Moderate to severe dehydration;resolved  - Creatinine peaked at 3.0 and it has trended down to baseline of 1.2 with IV fluids.  At this time she is tolerating oral diet well without any complaints.  She remains hemodynamically stable.  Generalized weakness -Physical therapy consulted, if necessary we will get patient placed at a skilled nursing facility.  Anemia - Hemoglobin trended down from the baseline of 14-10.4 after getting aggressive IV fluids.  Now remained stable around 10.5 without any signs of obvious blood loss.  Patient is reluctant to get any endoscopic procedures done at this time therefore I have educated the patient and the family member-son to follow-up outpatient with primary care physician within the next week.  If necessary follow-up with outpatient gastroenterology for further evaluation.  Nausea, vomiting and nonbloody diarrhea; resolved - likely viral Gastroenteritis.   Essential hypertension -resume home meds  DVT prophylaxis: SCDs Code Status: Full code Family Communication: Son at bedside  Disposition Plan: Discharge once evaluated by PT.     Discharge Instructions   Allergies as of 11/18/2017      Reactions   Ceftin [cefuroxime Axetil]    diarrhea      Medication List    TAKE these medications   Calcium Carbonate-Vitamin D 600-200 MG-UNIT Tabs Take 1 tablet by mouth daily.   enalapril 20 MG tablet Commonly known as:  VASOTEC TAKE 1 TABLET TWICE A DAY   hydrochlorothiazide 12.5 MG capsule Commonly known as:  MICROZIDE Take 1  capsule (12.5 mg total) by mouth daily.   hydrochlorothiazide 12.5 MG capsule Commonly known as:  MICROZIDE Take 12.5 mg by mouth daily.   metoprolol tartrate 25 MG tablet Commonly known as:  LOPRESSOR TAKE 1 AND 1/2 TABLET TWICE A DAY   mirtazapine 15 MG  tablet Commonly known as:  REMERON TAKE 1 TABLET AT BEDTIME      Follow-up Information    Pincus Sanes, MD. Schedule an appointment as soon as possible for a visit in 1 week(s).   Specialty:  Internal Medicine Contact information: [address] [place] [address] [phone]          Allergies  Allergen Reactions  . Ceftin [Cefuroxime Axetil]     diarrhea    You were cared for by a hospitalist during your hospital stay. If you have any questions about your discharge medications or the care you received while you were in the hospital after you are discharged, you can call the unit and asked to speak with the hospitalist on call if the hospitalist that took care of you is not available. Once you are discharged, your primary care physician will handle any further medical issues. Please note that no refills for any discharge medications will be authorized once you are discharged, as it is imperative that you return to your primary care physician (or establish a relationship with a primary care physician if you do not have one) for your aftercare needs so that they can reassess your need for medications and monitor your lab values.  Consultations:  None   Procedures/Studies: No results found.   Subjective: No complaints at all.  She is feeling better except appears generally weak.  Son at the bedside.  General = no fevers, chills, dizziness, malaise, fatigue HEENT/EYES = negative for pain, redness, loss of vision, double vision, blurred vision, loss of hearing, sore throat, hoarseness, dysphagia Cardiovascular= negative for chest pain, palpitation, murmurs, lower extremity swelling Respiratory/lungs= negative for shortness of breath, cough, hemoptysis, wheezing, mucus production Gastrointestinal= negative for nausea, vomiting,, abdominal pain, melena, hematemesis Genitourinary= negative for Dysuria, Hematuria, Change in Urinary Frequency MSK = Negative for arthralgia,  myalgias, Back Pain, Joint swelling  Neurology= Negative for headache, seizures, numbness, tingling  Psychiatry= Negative for anxiety, depression, suicidal and homocidal ideation Allergy/Immunology= Medication/Food allergy as listed  Skin= Negative for Rash, lesions, ulcers, itching   Discharge Exam: Vitals:   11/18/17 0320 11/18/17 0813  BP: (!) 152/71 (!) 166/77  Pulse: 66   Resp: 16   Temp: 99.7 F (37.6 C)   SpO2: 91%    Vitals:   11/18/17 0005 11/18/17 0100 11/18/17 0320 11/18/17 0813  BP: (!) 194/84 (!) 189/76 (!) 152/71 (!) 166/77  Pulse: 78  66   Resp:   16   Temp:   99.7 F (37.6 C)   TempSrc:   Oral   SpO2:   91%   Weight:      Height:        General: Pt is alert, awake, not in acute distress; elderly frail appearing, generally weak.  Cardiovascular: RRR, S1/S2 +, no rubs, no gallops Respiratory: CTA bilaterally, no wheezing, no rhonchi Abdominal: Soft, NT, ND, bowel sounds + Extremities: no edema, no cyanosis    The results of significant diagnostics from this hospitalization (including imaging, microbiology, ancillary and laboratory) are listed below for reference.     Microbiology: Recent Results (from the past 240 hour(s))  MRSA PCR Screening     Status: None  Collection Time: 11/16/17 10:20 AM  Result Value Ref Range Status   MRSA by PCR NEGATIVE NEGATIVE Final    Comment:        The GeneXpert MRSA Assay (FDA approved for NASAL specimens only), is one component of a comprehensive MRSA colonization surveillance program. It is not intended to diagnose MRSA infection nor to guide or monitor treatment for MRSA infections. Performed at [facility], [address] [place], [place] [address]      Labs: BNP (last 3 results) No results for input(s): BNP in the last 8760 hours. Basic Metabolic Panel: Recent Labs  Lab 11/16/17 0052 11/17/17 0421 11/18/17 0759  NA 140 141 139  K 3.9 3.0* 3.9  CL 103 111 110  CO2 24 23  23   GLUCOSE 90 89 106*  BUN 99* 54* 36*  CREATININE 3.01* 1.66* 1.26*  CALCIUM 9.6 8.4* 8.4*  MG  --   --  1.3*   Liver Function Tests: Recent Labs  Lab 11/16/17 0052  AST 36  ALT 18  ALKPHOS 114  BILITOT 1.3*  PROT 7.0  ALBUMIN 4.0   Recent Labs  Lab 11/16/17 0052 11/17/17 0421  LIPASE 62* 49   No results for input(s): AMMONIA in the last 168 hours. CBC: Recent Labs  Lab 11/16/17 0052 11/17/17 0421 11/18/17 0759  WBC 12.5* 6.4 6.4  NEUTROABS 9.8*  --   --   HGB 15.6* 10.7* 10.8*  HCT 44.8 30.9* 31.7*  MCV 83.0 82.4 82.6  PLT 228 154 162   Cardiac Enzymes: No results for input(s): CKTOTAL, CKMB, CKMBINDEX, TROPONINI in the last 168 hours. BNP: Invalid input(s): POCBNP CBG: No results for input(s): GLUCAP in the last 168 hours. D-Dimer No results for input(s): DDIMER in the last 72 hours. Hgb A1c No results for input(s): HGBA1C in the last 72 hours. Lipid Profile No results for input(s): CHOL, HDL, LDLCALC, TRIG, CHOLHDL, LDLDIRECT in the last 72 hours. Thyroid function studies No results for input(s): TSH, T4TOTAL, T3FREE, THYROIDAB in the last 72 hours.  Invalid input(s): FREET3 Anemia work up No results for input(s): VITAMINB12, FOLATE, FERRITIN, TIBC, IRON, RETICCTPCT in the last 72 hours. Urinalysis    Component Value Date/Time   COLORURINE YELLOW 11/16/2017 2202   APPEARANCEUR HAZY (A) 11/16/2017 2202   LABSPEC 1.013 11/16/2017 2202   PHURINE 8.0 11/16/2017 2202   GLUCOSEU NEGATIVE 11/16/2017 2202   GLUCOSEU NEGATIVE 11/04/2015 1440   HGBUR SMALL (A) 11/16/2017 2202   BILIRUBINUR NEGATIVE 11/16/2017 2202   KETONESUR NEGATIVE 11/16/2017 2202   PROTEINUR NEGATIVE 11/16/2017 2202   UROBILINOGEN 1.0 11/04/2015 1440   NITRITE NEGATIVE 11/16/2017 2202   LEUKOCYTESUR LARGE (A) 11/16/2017 2202   Sepsis Labs Invalid input(s): PROCALCITONIN,  WBC,  LACTICIDVEN Microbiology Recent Results (from the past 240 hour(s))  MRSA PCR Screening      Status: None   Collection Time: 11/16/17 10:20 AM  Result Value Ref Range Status   MRSA by PCR NEGATIVE NEGATIVE Final    Comment:        The GeneXpert MRSA Assay (FDA approved for NASAL specimens only), is one component of a comprehensive MRSA colonization surveillance program. It is not intended to diagnose MRSA infection nor to guide or monitor treatment for MRSA infections. Performed at [facility], [address] [place], [place] [address]      Time coordinating discharge:  I have spent 35 minutes face to face with the patient and on the ward discussing the patients  care, assessment, plan and disposition with other care givers. >50% of the time was devoted counseling the patient about the risks and benefits of treatment/Discharge disposition and coordinating care.   SIGNED:   Dimple Nanas, MD  [facility] 11/18/2017, 11:44 AM Pager   If 7PM-7AM, please contact night-coverage [email or web address] Password [email or web address]

## 2017-11-19 DIAGNOSIS — D649 Anemia, unspecified: Secondary | ICD-10-CM | POA: Diagnosis not present

## 2017-11-19 DIAGNOSIS — E871 Hypo-osmolality and hyponatremia: Secondary | ICD-10-CM | POA: Diagnosis not present

## 2017-11-19 DIAGNOSIS — R278 Other lack of coordination: Secondary | ICD-10-CM | POA: Diagnosis not present

## 2017-11-19 DIAGNOSIS — R2689 Other abnormalities of gait and mobility: Secondary | ICD-10-CM | POA: Diagnosis not present

## 2017-11-19 DIAGNOSIS — S90416A Abrasion, unspecified lesser toe(s), initial encounter: Secondary | ICD-10-CM | POA: Diagnosis not present

## 2017-11-19 DIAGNOSIS — R41841 Cognitive communication deficit: Secondary | ICD-10-CM | POA: Diagnosis not present

## 2017-11-19 DIAGNOSIS — Z743 Need for continuous supervision: Secondary | ICD-10-CM | POA: Diagnosis not present

## 2017-11-19 DIAGNOSIS — I10 Essential (primary) hypertension: Secondary | ICD-10-CM | POA: Diagnosis not present

## 2017-11-19 DIAGNOSIS — R279 Unspecified lack of coordination: Secondary | ICD-10-CM | POA: Diagnosis not present

## 2017-11-19 DIAGNOSIS — R197 Diarrhea, unspecified: Secondary | ICD-10-CM | POA: Diagnosis not present

## 2017-11-19 DIAGNOSIS — R531 Weakness: Secondary | ICD-10-CM | POA: Diagnosis not present

## 2017-11-19 DIAGNOSIS — N179 Acute kidney failure, unspecified: Secondary | ICD-10-CM | POA: Diagnosis not present

## 2017-11-19 DIAGNOSIS — E860 Dehydration: Secondary | ICD-10-CM | POA: Diagnosis not present

## 2017-11-19 DIAGNOSIS — M6281 Muscle weakness (generalized): Secondary | ICD-10-CM | POA: Diagnosis not present

## 2017-11-19 LAB — BASIC METABOLIC PANEL
ANION GAP: 7 (ref 5–15)
BUN: 25 mg/dL — AB (ref 8–23)
CALCIUM: 8.4 mg/dL — AB (ref 8.9–10.3)
CO2: 24 mmol/L (ref 22–32)
Chloride: 109 mmol/L (ref 98–111)
Creatinine, Ser: 1.08 mg/dL — ABNORMAL HIGH (ref 0.44–1.00)
GFR calc Af Amer: 51 mL/min — ABNORMAL LOW (ref >60)
GFR calc non Af Amer: 44 mL/min — ABNORMAL LOW (ref >60)
Glucose, Bld: 102 mg/dL — ABNORMAL HIGH (ref 70–99)
Potassium: 3.7 mmol/L (ref 3.5–5.1)
Sodium: 140 mmol/L (ref 135–145)

## 2017-11-19 LAB — CBC
HEMATOCRIT: 34.3 % — AB (ref 36.0–46.0)
Hemoglobin: 11.7 g/dL — ABNORMAL LOW (ref 12.0–15.0)
MCH: 27.9 pg (ref 26.0–34.0)
MCHC: 34.1 g/dL (ref 30.0–36.0)
MCV: 81.7 fL (ref 78.0–100.0)
Platelets: 164 10*3/uL (ref 150–400)
RBC: 4.2 MIL/uL (ref 3.87–5.11)
RDW: 13.3 % (ref 11.5–15.5)
WBC: 6.4 10*3/uL (ref 4.0–10.5)

## 2017-11-19 LAB — MAGNESIUM: Magnesium: 1.7 mg/dL (ref 1.7–2.4)

## 2017-11-19 MED ORDER — ENALAPRIL MALEATE 10 MG PO TABS
20.0000 mg | ORAL_TABLET | Freq: Two times a day (BID) | ORAL | Status: DC
  Administered 2017-11-19: 20 mg via ORAL
  Filled 2017-11-19: qty 2

## 2017-11-19 MED ORDER — AMLODIPINE BESYLATE 5 MG PO TABS: 5.0000 mg | ORAL_TABLET | Freq: Once | ORAL | Status: DC

## 2017-11-19 MED ORDER — HYDROCHLOROTHIAZIDE 12.5 MG PO CAPS
12.5000 mg | ORAL_CAPSULE | Freq: Every day | ORAL | Status: DC
  Administered 2017-11-19: 12.5 mg via ORAL
  Filled 2017-11-19: qty 1

## 2017-11-19 NOTE — Progress Notes (Signed)
PROGRESS NOTE    Olivia Gay  ZOX:[id number] DOB: November 10, 1926 DOA: 11/15/2017 PCP: Pincus Sanes, MD   Brief Narrative:  84 year old female with history of essential hypertension was sent to the hospital for evaluation of nausea, vomiting, diarrhea and near syncope episode.  Patient denied any specific complaints.  By the time she came to the ER her symptoms had improved but on the labs she was noted to have elevated creatinine of 3.0 which is up from baseline of 1.2.  She was admitted to the hospital for IV hydration and monitoring.  No further bleeding episodes in the hospital.  She remained hemodynamically stable.  With IV fluids her creatinine trended down to baseline of 1.2.  She was evaluated physical therapy who recommended skilled nursing facility.   Assessment & Plan:   Principal Problem:   AKI (acute kidney injury) (HCC) Active Problems:   Benign essential HTN   Nausea vomiting and diarrhea   Elevated lipase   Acute kidney injury; resolved  Moderate to severe dehydration;resolved  - Creatinine peaked at 3.0 and it has trended down to baseline of 1.2 with IV fluids.  At this time she is tolerating oral diet well without any complaints.  She remains hemodynamically stable.  Generalized weakness -Physical therapy consulted, who recommended skilled nursing facility  Anemia - Hemoglobin trended down from the baseline of 14-10.4 after getting aggressive IV fluids.  Now remained stable around 10.5 without any signs of obvious blood loss.  Patient is reluctant to get any endoscopic procedures done at this time therefore I have educated the patient and the family member-son to follow-up outpatient with primary care physician within the next week.  If necessary follow-up with outpatient gastroenterology for further evaluation.  Nausea, vomiting and nonbloody diarrhea; resolved - likely viral Gastroenteritis.   Essential hypertension -resume home meds  DVT  prophylaxis:SCDs Code Status:Full code Family Communication:None at bedside Disposition Plan:Was to skilled nursing facility    Consultants:   Gastroenterology  Procedures:   None  Antimicrobials:   None   Subjective: No acute events overnight.  Tolerating oral diet, no other complaints.  Review of Systems Otherwise negative except as per HPI, including: General = no fevers, chills, dizziness, malaise, fatigue HEENT/EYES = negative for pain, redness, loss of vision, double vision, blurred vision, loss of hearing, sore throat, hoarseness, dysphagia Cardiovascular= negative for chest pain, palpitation, murmurs, lower extremity swelling Respiratory/lungs= negative for shortness of breath, cough, hemoptysis, wheezing, mucus production Gastrointestinal= negative for nausea, vomiting,, abdominal pain, melena, hematemesis Genitourinary= negative for Dysuria, Hematuria, Change in Urinary Frequency MSK = Negative for arthralgia, myalgias, Back Pain, Joint swelling  Neurology= Negative for headache, seizures, numbness, tingling  Psychiatry= Negative for anxiety, depression, suicidal and homocidal ideation Allergy/Immunology= Medication/Food allergy as listed  Skin= Negative for Rash, lesions, ulcers, itching   Objective: Vitals:   11/18/17 2101 11/19/17 0513 11/19/17 0630 11/19/17 0906  BP: (!) 168/74 (!) 176/73 (!) 166/77 (!) 182/104  Pulse: 79 71 67 87  Resp: 16 18    Temp: 98.2 F (36.8 C) 98.7 F (37.1 C)    TempSrc: Oral Oral    SpO2: 94% 94%  98%  Weight:      Height:        Intake/Output Summary (Last 24 hours) at 11/19/2017 1026 Last data filed at 11/19/2017 0600 Gross per 24 hour  Intake 2015 ml  Output 1350 ml  Net 665 ml   Filed Weights   11/16/17 0638  Weight: 58.1 kg (128  lb)    Examination:  Constitutional: NAD, calm, comfortable, elderly frail-appearing.  Generally weak. Eyes: PERRL, lids and conjunctivae normal ENMT: Mucous membranes are  moist. Posterior pharynx clear of any exudate or lesions.Normal dentition.  Neck: normal, supple, no masses, no thyromegaly Respiratory: clear to auscultation bilaterally, no wheezing, no crackles. Normal respiratory effort. No accessory muscle use.  Cardiovascular: Regular rate and rhythm, no murmurs / rubs / gallops. No extremity edema. 2+ pedal pulses. No carotid bruits.  Abdomen: no tenderness, no masses palpated. No hepatosplenomegaly. Bowel sounds positive.  Musculoskeletal: no clubbing / cyanosis. No joint deformity upper and lower extremities. Good ROM, no contractures. Normal muscle tone.  Skin: no rashes, lesions, ulcers. No induration Neurologic: CN 2-12 grossly intact. Sensation intact, DTR normal. Strength 5/5 in all 4.  Psychiatric: Normal judgment and insight. Alert and oriented x 3. Normal mood.     Data Reviewed:   CBC: Recent Labs  Lab 11/16/17 0052 11/17/17 0421 11/18/17 0759 11/19/17 0404  WBC 12.5* 6.4 6.4 6.4  NEUTROABS 9.8*  --   --   --   HGB 15.6* 10.7* 10.8* 11.7*  HCT 44.8 30.9* 31.7* 34.3*  MCV 83.0 82.4 82.6 81.7  PLT 228 154 162 164   Basic Metabolic Panel: Recent Labs  Lab 11/16/17 0052 11/17/17 0421 11/18/17 0759 11/19/17 0404  NA 140 141 139 140  K 3.9 3.0* 3.9 3.7  CL 103 111 110 109  CO2 24 23 23 24   GLUCOSE 90 89 106* 102*  BUN 99* 54* 36* 25*  CREATININE 3.01* 1.66* 1.26* 1.08*  CALCIUM 9.6 8.4* 8.4* 8.4*  MG  --   --  1.3* 1.7   GFR: Estimated Creatinine Clearance: 32.4 mL/min (A) (by C-G formula based on SCr of 1.08 mg/dL (H)). Liver Function Tests: Recent Labs  Lab 11/16/17 0052  AST 36  ALT 18  ALKPHOS 114  BILITOT 1.3*  PROT 7.0  ALBUMIN 4.0   Recent Labs  Lab 11/16/17 0052 11/17/17 0421  LIPASE 62* 49   No results for input(s): AMMONIA in the last 168 hours. Coagulation Profile: No results for input(s): INR, PROTIME in the last 168 hours. Cardiac Enzymes: No results for input(s): CKTOTAL, CKMB, CKMBINDEX,  TROPONINI in the last 168 hours. BNP (last 3 results) No results for input(s): PROBNP in the last 8760 hours. HbA1C: No results for input(s): HGBA1C in the last 72 hours. CBG: No results for input(s): GLUCAP in the last 168 hours. Lipid Profile: No results for input(s): CHOL, HDL, LDLCALC, TRIG, CHOLHDL, LDLDIRECT in the last 72 hours. Thyroid Function Tests: No results for input(s): TSH, T4TOTAL, FREET4, T3FREE, THYROIDAB in the last 72 hours. Anemia Panel: No results for input(s): VITAMINB12, FOLATE, FERRITIN, TIBC, IRON, RETICCTPCT in the last 72 hours. Sepsis Labs: No results for input(s): PROCALCITON, LATICACIDVEN in the last 168 hours.  Recent Results (from the past 240 hour(s))  MRSA PCR Screening     Status: None   Collection Time: 11/16/17 10:20 AM  Result Value Ref Range Status   MRSA by PCR NEGATIVE NEGATIVE Final    Comment:        The GeneXpert MRSA Assay (FDA approved for NASAL specimens only), is one component of a comprehensive MRSA colonization surveillance program. It is not intended to diagnose MRSA infection nor to guide or monitor treatment for MRSA infections. Performed at [facility], [address], [place], [place] [address]          Radiology Studies: No results  found.      Scheduled Meds: . amLODipine  5 mg Oral Daily  . chlorhexidine  15 mL Mouth Rinse BID  . enalapril  20 mg Oral BID  . hydrochlorothiazide  12.5 mg Oral Daily  . mouth rinse  15 mL Mouth Rinse q12n4p  . metoprolol tartrate  25 mg Oral BID  . mirtazapine  15 mg Oral QHS   Continuous Infusions: . lactated ringers 75 mL/hr at 11/19/17 0630     LOS: 3 days    I have spent 35 minutes face to face with the patient and on the ward discussing the patients care, assessment, plan and disposition with other care givers. >50% of the time was devoted counseling the patient about the risks and benefits of treatment and coordinating care.     Mace Weinberg  Joline Maxcyhirag Marguarite Markov, MD [facility] Pager [phone]   If 7PM-7AM, please contact night-coverage [email or web address] Password [facility] 11/19/2017, 10:26 AM

## 2017-11-19 NOTE — [place] FL2 (Signed)
[place] MEDICAID FL2 LEVEL OF CARE SCREENING TOOL     IDENTIFICATION  Patient Name: Olivia Gay Birthdate: 05-17-1927 Sex: female Admission Date (Current Location): 11/15/2017  [facility] and [place] Number:  Producer, television/film/video and Address:  [facility],  [address] [place] [address]      Provider Number: [id number]  Attending Physician Name and Address:  Dimple Nanas, MD  Relative Name and Phone Number:       Current Level of Care: Hospital Recommended Level of Care: Skilled Nursing Facility Prior Approval Number:    Date Approved/Denied:   PASRR Number: [id number]  Discharge Plan: SNF    Current Diagnoses: Patient Active Problem List   Diagnosis Date Noted  . AKI (acute kidney injury) (HCC) 11/16/2017  . Benign essential HTN 11/16/2017  . Nausea vomiting and diarrhea 11/16/2017  . Elevated lipase 11/16/2017  . Anemia 08/31/2016  . Adjustment disorder with mixed anxiety and depressed mood 11/04/2015  . Fall 10/11/2015  . Pain in joint, shoulder region 10/11/2015  . C. difficile colitis 06/19/2012  . Herpes zoster 06/18/2012  . Aortic insufficiency 09/08/2010  . Migraines 08/05/2010  . Polio 08/05/2010    Orientation RESPIRATION BLADDER Height & Weight     Self, Situation, Place  Normal Continent Weight: 128 lb (58.1 kg) Height:  5\' 7"  (170.2 cm)  BEHAVIORAL SYMPTOMS/MOOD NEUROLOGICAL BOWEL NUTRITION STATUS        Diet(2g Na diet)  AMBULATORY STATUS COMMUNICATION OF NEEDS Skin   Limited Assist Verbally Normal                       Personal Care Assistance Level of Assistance  Bathing, Feeding, Dressing Bathing Assistance: Maximum assistance Feeding assistance: Independent Dressing Assistance: Maximum assistance     Functional Limitations Info  Sight, Hearing, Speech Sight Info: Adequate Hearing Info: Impaired Speech Info: Adequate    SPECIAL CARE FACTORS FREQUENCY  PT (By licensed PT), OT (By licensed OT)      PT Frequency: 5x/week OT Frequency: 5x/week            Contractures      Additional Factors Info  Code Status, Allergies Code Status Info: Full Code Allergies Info: Ceftin Cefuroxime Axetil           Current Medications (11/19/2017):  This is the current hospital active medication list Current Facility-Administered Medications  Medication Dose Route Frequency Provider Last Rate Last Dose  . acetaminophen (TYLENOL) tablet 650 mg  650 mg Oral Q6H PRN Amin, Ankit Chirag, MD       Or  . acetaminophen (TYLENOL) suppository 650 mg  650 mg Rectal Q6H PRN Amin, Ankit Chirag, MD      . amLODipine (NORVASC) tablet 5 mg  5 mg Oral Daily Amin, Ankit Chirag, MD   5 mg at 11/19/17 0913  . chlorhexidine (PERIDEX) 0.12 % solution 15 mL  15 mL Mouth Rinse BID Amin, Ankit Chirag, MD   15 mL at 11/18/17 2159  . enalapril (VASOTEC) tablet 20 mg  20 mg Oral BID Amin, Ankit Chirag, MD   20 mg at 11/19/17 0913  . hydrochlorothiazide (MICROZIDE) capsule 12.5 mg  12.5 mg Oral Daily Amin, Ankit Chirag, MD   12.5 mg at 11/19/17 0912  . lactated ringers infusion   Intravenous Continuous Amin, Ankit Chirag, MD 75 mL/hr at 11/19/17 0630    . MEDLINE mouth rinse  15 mL Mouth Rinse q12n4p Amin, Loura Halt, MD   15  mL at 11/16/17 1524  . metoprolol tartrate (LOPRESSOR) tablet 25 mg  25 mg Oral BID Amin, Ankit Chirag, MD   25 mg at 11/19/17 0913  . mirtazapine (REMERON) tablet 15 mg  15 mg Oral QHS Amin, Ankit Chirag, MD   15 mg at 11/18/17 2159     Discharge Medications: Please see discharge summary for a list of discharge medications.  Relevant Imaging Results:  Relevant Lab Results:   Additional Information SSN [id number]  Antionette PolesKimberly L Tanmay Halteman, LCSW

## 2017-11-19 NOTE — Progress Notes (Signed)
OT Cancellation Note  Patient Details Name: Antony ContrasWilma L Latini MRN: [id number] DOB: 11/30/1926   Cancelled Treatment:    Reason Eval/Treat Not Completed: Fatigue/lethargy limiting ability to participate.  Too fatiqued after PT. Will check back  [facility] SurgeryENCER,Sherena Machorro   Rontrell Moquin, OTR/L [phone] 11/19/2017 11/19/2017, 9:17 AM

## 2017-11-19 NOTE — Evaluation (Signed)
Physical Therapy Evaluation Patient Details Name: Olivia Gay MRN: [id number] DOB: 1926/08/21 Today's Date: 11/19/2017   History of Present Illness  84 year old female with history of essential hypertension was sent to the hospital for evaluation of nausea, vomiting, diarrhea and near syncope episode. Dx of AKI, positive fecal occult blood.   Clinical Impression  Pt admitted with above diagnosis. Pt currently with functional limitations due to the deficits listed below (see PT Problem List). Mod assist for supine to sit, min assist to stand from elevated bed, min assist for balance while ambulating 15' with RW. After urinating on commode, pt stood and reported feeling lightheaded. ASsisted pt to seated position, BP 182/104, HR 87, SaO2 98% on Room air. RN notified of BP. Pt is not safe to return to ILF, ST-SNF recommended. Pt/son agree to this plan. Pt will benefit from skilled PT to increase their independence and safety with mobility to allow discharge to the venue listed below.       Follow Up Recommendations SNF;Supervision for mobility/OOB(assist for mobility)    Equipment Recommendations  None recommended by PT    Recommendations for Other Services       Precautions / Restrictions Precautions Precautions: Fall Precaution Comments: pt denies falls in past 1 year, unsteady today Restrictions Weight Bearing Restrictions: No      Mobility  Bed Mobility Overal bed mobility: Needs Assistance Bed Mobility: Supine to Sit     Supine to sit: Mod assist     General bed mobility comments: assist to raise trunk  Transfers Overall transfer level: Needs assistance Equipment used: Rolling walker (2 wheeled) Transfers: Sit to/from Stand Sit to Stand: Mod assist;Min assist         General transfer comment: mod A from commode, Min A from elevated bed  Ambulation/Gait Ambulation/Gait assistance: Min assist Gait Distance (Feet): 15 Feet Assistive device: Rolling walker (2  wheeled) Gait Pattern/deviations: Step-through pattern;Decreased stride length Gait velocity: decr   General Gait Details: min A for unsteadiness, distance limited by lightheadedness, BP sitting (after ambulation) 182/104, HR 87, SaO2 98% on room air  Stairs            Wheelchair Mobility    Modified Rankin (Stroke Patients Only)       Balance Overall balance assessment: Needs assistance   Sitting balance-Leahy Scale: Fair Sitting balance - Comments: posterior lean when feet unsupported   Standing balance support: Bilateral upper extremity supported Standing balance-Leahy Scale: Poor Standing balance comment: relies on BUE support                             Pertinent Vitals/Pain Pain Assessment: No/denies pain    Home Living Family/patient expects to be discharged to:: Private residence Living Arrangements: Alone Available Help at Discharge: Family;Available PRN/intermittently Type of Home: Independent living facility Home Access: Level entry     Home Layout: One level Home Equipment: Walker - 2 wheels      Prior Function Level of Independence: Independent with assistive device(s)         Comments: walks with RW, sponge bathes independently, walks to dining room and goes to exercise and crochet classes, doesn't drive     Hand Dominance        Extremity/Trunk Assessment   Upper Extremity Assessment Upper Extremity Assessment: Overall WFL for tasks assessed    Lower Extremity Assessment Lower Extremity Assessment: LLE deficits/detail(pt reports h/o polio affecting LLE) LLE Deficits / Details: L knee  ext +4/5, L ankle DF -4/5    Cervical / Trunk Assessment Cervical / Trunk Assessment: Normal  Communication   Communication: HOH  Cognition Arousal/Alertness: Awake/alert Behavior During Therapy: WFL for tasks assessed/performed Overall Cognitive Status: Within Functional Limits for tasks assessed                                         General Comments      Exercises     Assessment/Plan    PT Assessment Patient needs continued PT services  PT Problem List Decreased activity tolerance;Decreased mobility;Decreased balance;Cardiopulmonary status limiting activity       PT Treatment Interventions Gait training;Therapeutic exercise;Therapeutic activities;Functional mobility training;Balance training;Patient/family education    PT Goals (Current goals can be found in the Care Plan section)  Acute Rehab PT Goals Patient Stated Goal: go to crochet and exercise class at ILF PT Goal Formulation: With patient/family Time For Goal Achievement: 12/03/17 Potential to Achieve Goals: Good    Frequency Min 2X/week   Barriers to discharge        Co-evaluation               AM-PAC PT "6 Clicks" Daily Activity  Outcome Measure Difficulty turning over in bed (including adjusting bedclothes, sheets and blankets)?: Unable Difficulty moving from lying on back to sitting on the side of the bed? : Unable Difficulty sitting down on and standing up from a chair with arms (e.g., wheelchair, bedside commode, etc,.)?: Unable Help needed moving to and from a bed to chair (including a wheelchair)?: A Lot Help needed walking in hospital room?: A Lot Help needed climbing 3-5 steps with a railing? : Total 6 Click Score: 8    End of Session Equipment Utilized During Treatment: Gait belt Activity Tolerance: Treatment limited secondary to medical complications (Comment)(lightheaded after walking) Patient left: in bed;with call bell/phone within reach;with family/visitor present;with nursing/sitter in room Nurse Communication: Mobility status PT Visit Diagnosis: Unsteadiness on feet (R26.81);Difficulty in walking, not elsewhere classified (R26.2);Dizziness and giddiness (R42)    Time: [id number] PT Time Calculation (min) (ACUTE ONLY): 32 min   Charges:   PT Evaluation $PT Eval Moderate Complexity: 1 Mod PT  Treatments $Gait Training: 8-22 mins   PT G Codes:         Tamala SerUhlenberg, Deanna Boehlke Kistler 11/19/2017, 9:17 AM [phone]

## 2017-11-19 NOTE — Care Management Note (Signed)
Case Management Note  Patient Details  Name: Olivia Gay MRN: [id number] Date of Birth: 1926-07-12  Subjective/Objective:                    Action/Plan:   Expected Discharge Date:  11/18/17               Expected Discharge Plan:  Skilled Nursing Facility(Independent living)  In-House Referral:     Discharge planning Services  CM Consult  Post Acute Care Choice:    Choice offered to:     DME Arranged:    DME Agency:     HH Arranged:    HH Agency:     Status of Service:  Completed, signed off  If discussed at [employer] Length of Stay Meetings, dates discussed:    Additional CommentsGeni Bers:  Audra Kagel, RN 11/19/2017, 11:16 AM

## 2017-11-19 NOTE — Care Management Important Message (Signed)
Important Message  Patient Details  Name: Olivia Gay Marker MRN: [id number] Date of Birth: [birth year]   Medicare Important Message Given:  Yes    Caren MacadamFuller, Ronnae Kaser 11/19/2017, 10:05 AMImportant Message  Patient Details  Name: Olivia Gay Monette MRN: [id number] Date of Birth: [birth year]   Medicare Important Message Given:  Yes    Caren MacadamFuller, Kinsey Karch 11/19/2017, 10:05 AM

## 2017-11-19 NOTE — Clinical Social Work Placement (Signed)
Patient [facility] and [facility]. Facility aware of patient's discharge and confirmed bed offer. PTAR contacted, patient's family notified. Patient's RN can call report to [phone] Room 106P, packet complete. CSW signing off, no other needs identified at this time.  CLINICAL SOCIAL WORK PLACEMENT  NOTE  Date:  11/19/2017  Patient Details  Name: Olivia ContrasWilma L Olveda MRN: [id number] Date of Birth: 03-09-1927  Clinical Social Work is seeking post-discharge placement for this patient at the Skilled  Nursing Facility level of care (*CSW will initial, date and re-position this form in  chart as items are completed):  Yes   Patient/family provided with [place] Clinical Social Work Department's list of facilities offering this level of care within the geographic area requested by the patient (or if unable, by the patient's family).  Yes   Patient/family informed of their freedom to choose among providers that offer the needed level of care, that participate in Medicare, Medicaid or managed care program needed by the patient, have an available bed and are willing to accept the patient.  Yes   Patient/family informed of [facility]'s ownership interest in [facility] and [facility], as well as of the fact that they are under no obligation to receive care at these facilities.  PASRR submitted to EDS on       PASRR number received on       Existing PASRR number confirmed on 11/19/17     FL2 transmitted to all facilities in geographic area requested by pt/family on 11/19/17     FL2 transmitted to all facilities within larger geographic area on       Patient informed that his/her managed care company has contracts with or will negotiate with certain facilities, including the following:        Yes   Patient/family informed of bed offers received.  [facility]     Physician recommends and patient chooses bed at      [facility]  [facility].  Patient to be transferred to facility by PTAR     Patient family notified on 11/19/17 of transfer.  Name of family member notified:  Tomasa HostellerSteve Altschuler     PHYSICIAN       Additional Comment:    _______________________________________________ Antionette PolesKimberly L Georgina Krist, LCSW 11/19/2017, 12:55 PM

## 2017-11-19 NOTE — Progress Notes (Signed)
Pt discharged from the unit via PTAR. Discharge instructions were sent with transport. Pt son was given pt belongings and will transport them to facility. Report called to Judeth CornfieldStephanie, Charity fundraiserN at [facility].

## 2017-11-19 NOTE — Clinical Social Work Note (Signed)
Clinical Social Work Assessment  Patient Details  Name: Olivia ContrasWilma L Meda MRN: [id number] Date of Birth: [birth year]  Date of referral:  11/19/17               Reason for consult:  Facility Placement                Permission sought to share information with:  Ocean[facility]ility Contact Representative Permission granted to share information::  Yes, Verbal Permission Granted  Name::        Agency::     Relationship::     Contact Information:     Housing/Transportation Living arrangements for the past 2 months:  Independent Living Facility([facility]) Source of Information:  Patient, Adult Children(Son - Tomasa HostellerSteve Biscoe) Patient Interpreter Needed:  None Criminal Activity/Legal Involvement Pertinent to Current Situation/Hospitalization:  No - Comment as needed Significant Relationships:  Adult Children Lives with:  Self Do you feel safe going back to the place where you live?  (PT recommending SNF) Need for family participation in patient care:  Yes (Comment)  Care giving concerns:  Patient [facility]. Patient reported that she is independent at baseline. Patient admitted with AKI (acute kidney injury). PT recommending SNF; Supervision for mobility/OOB(assist for mobility).   Social Worker assessment / plan:  CSW spoke with patient/patient's son at bedside regarding patient's discharge planning and PT recommendation for SNF. Patient/patient's son agreeable to SNF for ST rehab and reported preferences [facility] and [facility]. CSW explained SNF placement process and Medicare coverage for SNF, patient's son verbalized understanding. CSW agreed to complete patient's FL2 and follow up with preferred SNFs.  CSW completed patient's FL2 and will follow up with bed offers.  CSW will continue to follow and assist with discharge planning.  Employment status:  Retired Health and safety inspectornsurance information:  Medicare PT Recommendations:   Skilled Nursing Facility Information / Referral to community resources:  Skilled Nursing Facility  Patient/Family's Response to care:  Patient's son appreciative of CSW assistance with discharge planning.  Patient/Family's Understanding of and Emotional Response to Diagnosis, Current Treatment, and Prognosis:  Patient presented calm and deferred to her son discuss discharge planning. Patient's son verbalized understanding of patient's current treatment plan and verbalized plan for patient to dc to SNF for ST rehab. Patient agreeable to SNF for ST rehab.   Emotional Assessment Appearance:  Appears stated age Attitude/Demeanor/Rapport:  Other(Cooperative) Affect (typically observed):  Appropriate Orientation:  Oriented to Self, Oriented to Place, Oriented to Situation Alcohol / Substance use:  Not Applicable Psych involvement (Current and /or in the community):  No (Comment)  Discharge Needs  Concerns to be addressed:  Care Coordination Readmission within the last 30 days:  No Current discharge risk:  Lives alone, Physical Impairment Barriers to Discharge:  No Barriers Identified   Antionette PolesKimberly L Tola Meas, LCSW 11/19/2017, 10:42 AM

## 2017-11-19 NOTE — Progress Notes (Signed)
OT Cancellation Note  Patient Details Name: Olivia Gay MRN: [id number] DOB: February 15, 1927   Cancelled Treatment:    Reason Eval/Treat Not Completed: Other (comment).  Noted pt is going to be transferred [facility] today. Will defer OT eval to that venue.  [name] 11/19/2017, 1:22 PM  Marica OtterMaryellen Tniya Bowditch, OTR/L [phone] 11/19/2017

## 2017-11-22 DIAGNOSIS — R197 Diarrhea, unspecified: Secondary | ICD-10-CM | POA: Diagnosis not present

## 2017-11-22 DIAGNOSIS — N179 Acute kidney failure, unspecified: Secondary | ICD-10-CM | POA: Diagnosis not present

## 2017-11-22 DIAGNOSIS — D649 Anemia, unspecified: Secondary | ICD-10-CM | POA: Diagnosis not present

## 2017-11-22 DIAGNOSIS — I10 Essential (primary) hypertension: Secondary | ICD-10-CM | POA: Diagnosis not present

## 2017-11-25 DIAGNOSIS — R531 Weakness: Secondary | ICD-10-CM | POA: Diagnosis not present

## 2017-11-25 DIAGNOSIS — R197 Diarrhea, unspecified: Secondary | ICD-10-CM | POA: Diagnosis not present

## 2017-11-25 DIAGNOSIS — I10 Essential (primary) hypertension: Secondary | ICD-10-CM | POA: Diagnosis not present

## 2017-11-28 DIAGNOSIS — I10 Essential (primary) hypertension: Secondary | ICD-10-CM | POA: Diagnosis not present

## 2017-12-03 DIAGNOSIS — S90416A Abrasion, unspecified lesser toe(s), initial encounter: Secondary | ICD-10-CM | POA: Diagnosis not present

## 2017-12-06 DIAGNOSIS — S90416A Abrasion, unspecified lesser toe(s), initial encounter: Secondary | ICD-10-CM | POA: Diagnosis not present

## 2017-12-06 DIAGNOSIS — N179 Acute kidney failure, unspecified: Secondary | ICD-10-CM | POA: Diagnosis not present

## 2017-12-06 DIAGNOSIS — E871 Hypo-osmolality and hyponatremia: Secondary | ICD-10-CM | POA: Diagnosis not present

## 2017-12-06 DIAGNOSIS — D649 Anemia, unspecified: Secondary | ICD-10-CM | POA: Diagnosis not present

## 2017-12-21 DIAGNOSIS — D649 Anemia, unspecified: Secondary | ICD-10-CM | POA: Diagnosis not present

## 2017-12-21 DIAGNOSIS — E871 Hypo-osmolality and hyponatremia: Secondary | ICD-10-CM | POA: Diagnosis not present

## 2017-12-21 DIAGNOSIS — R197 Diarrhea, unspecified: Secondary | ICD-10-CM | POA: Diagnosis not present

## 2017-12-21 DIAGNOSIS — I10 Essential (primary) hypertension: Secondary | ICD-10-CM | POA: Diagnosis not present
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
## 2017-12-23 ENCOUNTER — Ambulatory Visit (INDEPENDENT_AMBULATORY_CARE_PROVIDER_SITE_OTHER): Payer: Medicare Other | Admitting: Internal Medicine

## 2017-12-23 ENCOUNTER — Encounter: Payer: Self-pay | Admitting: Internal Medicine

## 2017-12-23 ENCOUNTER — Other Ambulatory Visit (INDEPENDENT_AMBULATORY_CARE_PROVIDER_SITE_OTHER): Payer: Medicare Other

## 2017-12-23 VITALS — BP 130/70 | HR 80 | Ht 67.0 in | Wt 127.0 lb

## 2017-12-23 DIAGNOSIS — R278 Other lack of coordination: Secondary | ICD-10-CM | POA: Diagnosis not present

## 2017-12-23 DIAGNOSIS — R2689 Other abnormalities of gait and mobility: Secondary | ICD-10-CM | POA: Diagnosis not present

## 2017-12-23 DIAGNOSIS — R5381 Other malaise: Secondary | ICD-10-CM | POA: Diagnosis not present

## 2017-12-23 DIAGNOSIS — R197 Diarrhea, unspecified: Secondary | ICD-10-CM | POA: Diagnosis not present

## 2017-12-23 DIAGNOSIS — E860 Dehydration: Secondary | ICD-10-CM | POA: Diagnosis not present

## 2017-12-23 DIAGNOSIS — R112 Nausea with vomiting, unspecified: Secondary | ICD-10-CM

## 2017-12-23 DIAGNOSIS — D649 Anemia, unspecified: Secondary | ICD-10-CM

## 2017-12-23 DIAGNOSIS — R2681 Unsteadiness on feet: Secondary | ICD-10-CM | POA: Diagnosis not present

## 2017-12-23 DIAGNOSIS — N179 Acute kidney failure, unspecified: Secondary | ICD-10-CM

## 2017-12-23 DIAGNOSIS — M6281 Muscle weakness (generalized): Secondary | ICD-10-CM | POA: Diagnosis not present

## 2017-12-23 DIAGNOSIS — R262 Difficulty in walking, not elsewhere classified: Secondary | ICD-10-CM | POA: Diagnosis not present

## 2017-12-23 DIAGNOSIS — I10 Essential (primary) hypertension: Secondary | ICD-10-CM

## 2017-12-23 LAB — COMPREHENSIVE METABOLIC PANEL
ALK PHOS: 62 U/L (ref 39–117)
ALT: 15 U/L (ref 0–35)
AST: 16 U/L (ref 0–37)
Albumin: 3.8 g/dL (ref 3.5–5.2)
BILIRUBIN TOTAL: 0.7 mg/dL (ref 0.2–1.2)
BUN: 28 mg/dL — AB (ref 6–23)
CO2: 27 mEq/L (ref 19–32)
CREATININE: 1.08 mg/dL (ref 0.40–1.20)
Calcium: 9.5 mg/dL (ref 8.4–10.5)
Chloride: 92 mEq/L — ABNORMAL LOW (ref 96–112)
GFR: 50.75 mL/min — ABNORMAL LOW (ref >60.00)
GLUCOSE: 142 mg/dL — AB (ref 70–99)
POTASSIUM: 4.2 meq/L (ref 3.5–5.1)
SODIUM: 125 meq/L — AB (ref 135–145)
TOTAL PROTEIN: 6.3 g/dL (ref 6.0–8.3)

## 2017-12-23 LAB — CBC WITH DIFFERENTIAL/PLATELET
BASOS ABS: 0 10*3/uL (ref 0.0–0.1)
Basophils Relative: 0.5 % (ref 0.0–3.0)
EOS ABS: 0 10*3/uL (ref 0.0–0.7)
EOS PCT: 0.1 % (ref 0.0–5.0)
HCT: 35 % — ABNORMAL LOW (ref 36.0–46.0)
HEMOGLOBIN: 12.2 g/dL (ref 12.0–15.0)
LYMPHS ABS: 1.5 10*3/uL (ref 0.7–4.0)
Lymphocytes Relative: 16 % (ref 12.0–46.0)
MCHC: 34.7 g/dL (ref 30.0–36.0)
MCV: 81.7 fl (ref 78.0–100.0)
MONO ABS: 0.6 10*3/uL (ref 0.1–1.0)
Monocytes Relative: 6.9 % (ref 3.0–12.0)
NEUTROS PCT: 76.5 % (ref 43.0–77.0)
Neutro Abs: 7 10*3/uL (ref 1.4–7.7)
Platelets: 239 10*3/uL (ref 150.0–400.0)
RBC: 4.29 Mil/uL (ref 3.87–5.11)
RDW: 13.9 % (ref 11.5–15.5)
WBC: 9.2 10*3/uL (ref 4.0–10.5)

## 2017-12-23 LAB — FERRITIN: Ferritin: 245.6 ng/mL (ref 10.0–291.0)

## 2017-12-23 LAB — IRON: Iron: 63 ug/dL (ref 42–145)

## 2017-12-23 NOTE — Patient Instructions (Addendum)
  Test(s) ordered today. Your results will be released to MyChart (or called to you) after review, usually within 72hours after test completion. If any changes need to be made, you will be notified at that same time.   Medications reviewed and updated.  Changes include stopping the hydrochlorothiazide.

## 2017-12-23 NOTE — Assessment & Plan Note (Signed)
Resolved, likely viral gastroenteritis

## 2017-12-23 NOTE — Assessment & Plan Note (Signed)
Blood pressure good here today Officially discontinue hydrochlorothiazide given recent hyponatremia Continue metoprolol and enalapril CMP

## 2017-12-23 NOTE — Assessment & Plan Note (Signed)
Resolved with IV fluids Discussed the importance of letting someone know if she has a viral illness-she is high risk of dehydration Currently eating and drinking normally CMP today

## 2017-12-23 NOTE — Assessment & Plan Note (Signed)
Improved at SNF Still weak with poor balance and deconditioning Home PT ordered

## 2017-12-23 NOTE — Assessment & Plan Note (Signed)
Anemia improved while in the hospital-decrease was likely related to IVF Recheck CBC today She was Hemoccult positive, but no evidence of bleeding then or now Will monitor and refer to GI only if needed given her age and the fact that she is asymptomatic

## 2017-12-23 NOTE — Assessment & Plan Note (Signed)
Secondary to viral gastroenteritis Resolved/improved with IV fluids Recheck CMP

## 2017-12-23 NOTE — Assessment & Plan Note (Signed)
Improved at SNF Still weak with poor balance and deconditioning Home PT ordered 

## 2017-12-24 ENCOUNTER — Other Ambulatory Visit: Payer: Self-pay | Admitting: Emergency Medicine

## 2017-12-24 DIAGNOSIS — E871 Hypo-osmolality and hyponatremia: Secondary | ICD-10-CM

## 2017-12-27 DIAGNOSIS — R2681 Unsteadiness on feet: Secondary | ICD-10-CM | POA: Diagnosis not present

## 2017-12-27 DIAGNOSIS — M6281 Muscle weakness (generalized): Secondary | ICD-10-CM | POA: Diagnosis not present

## 2017-12-27 DIAGNOSIS — R278 Other lack of coordination: Secondary | ICD-10-CM | POA: Diagnosis not present

## 2017-12-27 DIAGNOSIS — R262 Difficulty in walking, not elsewhere classified: Secondary | ICD-10-CM | POA: Diagnosis not present

## 2017-12-28 DIAGNOSIS — R262 Difficulty in walking, not elsewhere classified: Secondary | ICD-10-CM | POA: Diagnosis not present

## 2017-12-28 DIAGNOSIS — R2681 Unsteadiness on feet: Secondary | ICD-10-CM | POA: Diagnosis not present

## 2017-12-28 DIAGNOSIS — M6281 Muscle weakness (generalized): Secondary | ICD-10-CM | POA: Diagnosis not present

## 2017-12-28 DIAGNOSIS — R278 Other lack of coordination: Secondary | ICD-10-CM | POA: Diagnosis not present

## 2017-12-29 DIAGNOSIS — R278 Other lack of coordination: Secondary | ICD-10-CM | POA: Diagnosis not present

## 2017-12-29 DIAGNOSIS — R262 Difficulty in walking, not elsewhere classified: Secondary | ICD-10-CM | POA: Diagnosis not present

## 2017-12-29 DIAGNOSIS — M6281 Muscle weakness (generalized): Secondary | ICD-10-CM | POA: Diagnosis not present

## 2017-12-29 DIAGNOSIS — R2681 Unsteadiness on feet: Secondary | ICD-10-CM | POA: Diagnosis not present

## 2017-12-30 ENCOUNTER — Other Ambulatory Visit (INDEPENDENT_AMBULATORY_CARE_PROVIDER_SITE_OTHER): Payer: Medicare Other

## 2017-12-30 DIAGNOSIS — E871 Hypo-osmolality and hyponatremia: Secondary | ICD-10-CM | POA: Diagnosis not present

## 2017-12-30 LAB — COMPREHENSIVE METABOLIC PANEL
ALT: 14 U/L (ref 0–35)
AST: 16 U/L (ref 0–37)
Albumin: 4.1 g/dL (ref 3.5–5.2)
Alkaline Phosphatase: 66 U/L (ref 39–117)
BUN: 24 mg/dL — ABNORMAL HIGH (ref 6–23)
CALCIUM: 9.9 mg/dL (ref 8.4–10.5)
CHLORIDE: 102 meq/L (ref 96–112)
CO2: 25 meq/L (ref 19–32)
Creatinine, Ser: 1.23 mg/dL — ABNORMAL HIGH (ref 0.40–1.20)
GFR: 43.67 mL/min — ABNORMAL LOW (ref >60.00)
Glucose, Bld: 120 mg/dL — ABNORMAL HIGH (ref 70–99)
Potassium: 4 mEq/L (ref 3.5–5.1)
Sodium: 137 mEq/L (ref 135–145)
Total Bilirubin: 0.8 mg/dL (ref 0.2–1.2)
Total Protein: 6.9 g/dL (ref 6.0–8.3)

## 2018-01-06 DIAGNOSIS — R2681 Unsteadiness on feet: Secondary | ICD-10-CM | POA: Diagnosis not present

## 2018-01-06 DIAGNOSIS — R262 Difficulty in walking, not elsewhere classified: Secondary | ICD-10-CM | POA: Diagnosis not present

## 2018-01-06 DIAGNOSIS — R278 Other lack of coordination: Secondary | ICD-10-CM | POA: Diagnosis not present

## 2018-01-06 DIAGNOSIS — M6281 Muscle weakness (generalized): Secondary | ICD-10-CM | POA: Diagnosis not present

## 2018-01-07 DIAGNOSIS — M6281 Muscle weakness (generalized): Secondary | ICD-10-CM | POA: Diagnosis not present

## 2018-01-07 DIAGNOSIS — R2681 Unsteadiness on feet: Secondary | ICD-10-CM | POA: Diagnosis not present

## 2018-01-07 DIAGNOSIS — R262 Difficulty in walking, not elsewhere classified: Secondary | ICD-10-CM | POA: Diagnosis not present

## 2018-01-07 DIAGNOSIS — R278 Other lack of coordination: Secondary | ICD-10-CM | POA: Diagnosis not present

## 2018-01-10 DIAGNOSIS — R262 Difficulty in walking, not elsewhere classified: Secondary | ICD-10-CM | POA: Diagnosis not present

## 2018-01-10 DIAGNOSIS — M6281 Muscle weakness (generalized): Secondary | ICD-10-CM | POA: Diagnosis not present

## 2018-01-10 DIAGNOSIS — R278 Other lack of coordination: Secondary | ICD-10-CM | POA: Diagnosis not present

## 2018-01-10 DIAGNOSIS — R2681 Unsteadiness on feet: Secondary | ICD-10-CM | POA: Diagnosis not present

## 2018-01-11 DIAGNOSIS — R278 Other lack of coordination: Secondary | ICD-10-CM | POA: Diagnosis not present

## 2018-01-11 DIAGNOSIS — R2681 Unsteadiness on feet: Secondary | ICD-10-CM | POA: Diagnosis not present

## 2018-01-11 DIAGNOSIS — M6281 Muscle weakness (generalized): Secondary | ICD-10-CM | POA: Diagnosis not present

## 2018-01-11 DIAGNOSIS — R262 Difficulty in walking, not elsewhere classified: Secondary | ICD-10-CM | POA: Diagnosis not present

## 2018-01-12 DIAGNOSIS — R262 Difficulty in walking, not elsewhere classified: Secondary | ICD-10-CM | POA: Diagnosis not present

## 2018-01-12 DIAGNOSIS — R278 Other lack of coordination: Secondary | ICD-10-CM | POA: Diagnosis not present

## 2018-01-12 DIAGNOSIS — M6281 Muscle weakness (generalized): Secondary | ICD-10-CM | POA: Diagnosis not present

## 2018-01-12 DIAGNOSIS — R2681 Unsteadiness on feet: Secondary | ICD-10-CM | POA: Diagnosis not present

## 2018-01-13 DIAGNOSIS — R278 Other lack of coordination: Secondary | ICD-10-CM | POA: Diagnosis not present

## 2018-01-13 DIAGNOSIS — R262 Difficulty in walking, not elsewhere classified: Secondary | ICD-10-CM | POA: Diagnosis not present

## 2018-01-13 DIAGNOSIS — M6281 Muscle weakness (generalized): Secondary | ICD-10-CM | POA: Diagnosis not present

## 2018-01-13 DIAGNOSIS — R2681 Unsteadiness on feet: Secondary | ICD-10-CM | POA: Diagnosis not present

## 2018-01-14 DIAGNOSIS — M6281 Muscle weakness (generalized): Secondary | ICD-10-CM | POA: Diagnosis not present

## 2018-01-14 DIAGNOSIS — R262 Difficulty in walking, not elsewhere classified: Secondary | ICD-10-CM | POA: Diagnosis not present

## 2018-01-14 DIAGNOSIS — R2681 Unsteadiness on feet: Secondary | ICD-10-CM | POA: Diagnosis not present

## 2018-01-14 DIAGNOSIS — R278 Other lack of coordination: Secondary | ICD-10-CM | POA: Diagnosis not present

## 2018-01-17 DIAGNOSIS — R278 Other lack of coordination: Secondary | ICD-10-CM | POA: Diagnosis not present

## 2018-01-17 DIAGNOSIS — R262 Difficulty in walking, not elsewhere classified: Secondary | ICD-10-CM | POA: Diagnosis not present

## 2018-01-17 DIAGNOSIS — M6281 Muscle weakness (generalized): Secondary | ICD-10-CM | POA: Diagnosis not present

## 2018-01-17 DIAGNOSIS — R2681 Unsteadiness on feet: Secondary | ICD-10-CM | POA: Diagnosis not present

## 2018-01-19 DIAGNOSIS — M6281 Muscle weakness (generalized): Secondary | ICD-10-CM | POA: Diagnosis not present

## 2018-01-19 DIAGNOSIS — R262 Difficulty in walking, not elsewhere classified: Secondary | ICD-10-CM | POA: Diagnosis not present

## 2018-01-19 DIAGNOSIS — R2681 Unsteadiness on feet: Secondary | ICD-10-CM | POA: Diagnosis not present

## 2018-01-19 DIAGNOSIS — R278 Other lack of coordination: Secondary | ICD-10-CM | POA: Diagnosis not present

## 2018-01-20 DIAGNOSIS — R262 Difficulty in walking, not elsewhere classified: Secondary | ICD-10-CM | POA: Diagnosis not present

## 2018-01-20 DIAGNOSIS — R278 Other lack of coordination: Secondary | ICD-10-CM | POA: Diagnosis not present

## 2018-01-20 DIAGNOSIS — M6281 Muscle weakness (generalized): Secondary | ICD-10-CM | POA: Diagnosis not present

## 2018-01-20 DIAGNOSIS — R2681 Unsteadiness on feet: Secondary | ICD-10-CM | POA: Diagnosis not present

## 2018-01-21 DIAGNOSIS — R278 Other lack of coordination: Secondary | ICD-10-CM | POA: Diagnosis not present

## 2018-01-21 DIAGNOSIS — R2681 Unsteadiness on feet: Secondary | ICD-10-CM | POA: Diagnosis not present

## 2018-01-21 DIAGNOSIS — R262 Difficulty in walking, not elsewhere classified: Secondary | ICD-10-CM | POA: Diagnosis not present

## 2018-01-21 DIAGNOSIS — M6281 Muscle weakness (generalized): Secondary | ICD-10-CM | POA: Diagnosis not present

## 2018-01-25 DIAGNOSIS — R262 Difficulty in walking, not elsewhere classified: Secondary | ICD-10-CM | POA: Diagnosis not present

## 2018-01-25 DIAGNOSIS — M6281 Muscle weakness (generalized): Secondary | ICD-10-CM | POA: Diagnosis not present

## 2018-01-25 DIAGNOSIS — R2681 Unsteadiness on feet: Secondary | ICD-10-CM | POA: Diagnosis not present

## 2018-01-25 DIAGNOSIS — R278 Other lack of coordination: Secondary | ICD-10-CM | POA: Diagnosis not present

## 2018-01-26 DIAGNOSIS — R2681 Unsteadiness on feet: Secondary | ICD-10-CM | POA: Diagnosis not present

## 2018-01-26 DIAGNOSIS — R278 Other lack of coordination: Secondary | ICD-10-CM | POA: Diagnosis not present

## 2018-01-26 DIAGNOSIS — M6281 Muscle weakness (generalized): Secondary | ICD-10-CM | POA: Diagnosis not present

## 2018-01-26 DIAGNOSIS — R262 Difficulty in walking, not elsewhere classified: Secondary | ICD-10-CM | POA: Diagnosis not present

## 2018-01-27 DIAGNOSIS — R262 Difficulty in walking, not elsewhere classified: Secondary | ICD-10-CM | POA: Diagnosis not present

## 2018-01-27 DIAGNOSIS — R2681 Unsteadiness on feet: Secondary | ICD-10-CM | POA: Diagnosis not present

## 2018-01-27 DIAGNOSIS — M6281 Muscle weakness (generalized): Secondary | ICD-10-CM | POA: Diagnosis not present

## 2018-01-27 DIAGNOSIS — R278 Other lack of coordination: Secondary | ICD-10-CM | POA: Diagnosis not present

## 2018-01-31 DIAGNOSIS — R2681 Unsteadiness on feet: Secondary | ICD-10-CM | POA: Diagnosis not present

## 2018-01-31 DIAGNOSIS — M6281 Muscle weakness (generalized): Secondary | ICD-10-CM | POA: Diagnosis not present

## 2018-01-31 DIAGNOSIS — R278 Other lack of coordination: Secondary | ICD-10-CM | POA: Diagnosis not present

## 2018-01-31 DIAGNOSIS — R262 Difficulty in walking, not elsewhere classified: Secondary | ICD-10-CM | POA: Diagnosis not present

## 2018-02-02 DIAGNOSIS — R262 Difficulty in walking, not elsewhere classified: Secondary | ICD-10-CM | POA: Diagnosis not present

## 2018-02-02 DIAGNOSIS — M6281 Muscle weakness (generalized): Secondary | ICD-10-CM | POA: Diagnosis not present

## 2018-02-02 DIAGNOSIS — R278 Other lack of coordination: Secondary | ICD-10-CM | POA: Diagnosis not present

## 2018-02-02 DIAGNOSIS — R2681 Unsteadiness on feet: Secondary | ICD-10-CM | POA: Diagnosis not present

## 2018-02-03 DIAGNOSIS — R278 Other lack of coordination: Secondary | ICD-10-CM | POA: Diagnosis not present

## 2018-02-03 DIAGNOSIS — R2681 Unsteadiness on feet: Secondary | ICD-10-CM | POA: Diagnosis not present

## 2018-02-03 DIAGNOSIS — M6281 Muscle weakness (generalized): Secondary | ICD-10-CM | POA: Diagnosis not present

## 2018-02-03 DIAGNOSIS — R262 Difficulty in walking, not elsewhere classified: Secondary | ICD-10-CM | POA: Diagnosis not present

## 2018-02-07 DIAGNOSIS — R278 Other lack of coordination: Secondary | ICD-10-CM | POA: Diagnosis not present

## 2018-02-07 DIAGNOSIS — M6281 Muscle weakness (generalized): Secondary | ICD-10-CM | POA: Diagnosis not present

## 2018-02-07 DIAGNOSIS — R2681 Unsteadiness on feet: Secondary | ICD-10-CM | POA: Diagnosis not present

## 2018-02-07 DIAGNOSIS — R262 Difficulty in walking, not elsewhere classified: Secondary | ICD-10-CM | POA: Diagnosis not present

## 2018-02-10 DIAGNOSIS — R2681 Unsteadiness on feet: Secondary | ICD-10-CM | POA: Diagnosis not present

## 2018-02-10 DIAGNOSIS — M6281 Muscle weakness (generalized): Secondary | ICD-10-CM | POA: Diagnosis not present

## 2018-02-10 DIAGNOSIS — R278 Other lack of coordination: Secondary | ICD-10-CM | POA: Diagnosis not present

## 2018-02-10 DIAGNOSIS — R262 Difficulty in walking, not elsewhere classified: Secondary | ICD-10-CM | POA: Diagnosis not present

## 2018-02-25 ENCOUNTER — Other Ambulatory Visit: Payer: Self-pay | Admitting: Cardiology

## 2018-02-25 NOTE — Telephone Encounter (Signed)
Rx request sent to pharmacy.  

## 2018-03-17 ENCOUNTER — Other Ambulatory Visit: Payer: Self-pay | Admitting: Cardiology

## 2018-04-04 ENCOUNTER — Encounter: Payer: Self-pay | Admitting: Cardiology
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
## 2018-04-04 NOTE — Patient Instructions (Addendum)
  Medications reviewed and updated.  Changes include :   none    Please followup in 6 months   

## 2018-04-05 ENCOUNTER — Ambulatory Visit (INDEPENDENT_AMBULATORY_CARE_PROVIDER_SITE_OTHER): Payer: Medicare Other | Admitting: Internal Medicine

## 2018-04-05 ENCOUNTER — Encounter: Payer: Self-pay | Admitting: Internal Medicine

## 2018-04-05 VITALS — BP 136/86 | HR 59 | Temp 97.6°F | Resp 16 | Ht 67.0 in | Wt 126.0 lb

## 2018-04-05 DIAGNOSIS — I10 Essential (primary) hypertension: Secondary | ICD-10-CM

## 2018-04-05 DIAGNOSIS — I351 Nonrheumatic aortic (valve) insufficiency: Secondary | ICD-10-CM | POA: Diagnosis not present

## 2018-04-05 DIAGNOSIS — F4323 Adjustment disorder with mixed anxiety and depressed mood: Secondary | ICD-10-CM

## 2018-04-05 DIAGNOSIS — Z23 Encounter for immunization: Secondary | ICD-10-CM

## 2018-04-05 MED ORDER — ENALAPRIL MALEATE 20 MG PO TABS: 20.0000 mg | ORAL_TABLET | Freq: Two times a day (BID) | ORAL | 1 refills | Status: DC

## 2018-04-05 NOTE — Addendum Note (Signed)
Addended by: Mercer PodWRENN, Carlee Vonderhaar E on: 04/05/2018 04:09 PM   Modules accepted: Orders

## 2018-04-05 NOTE — Assessment & Plan Note (Signed)
Depression and sleep controlled, stable Continue current dose of medication

## 2018-04-05 NOTE — Assessment & Plan Note (Signed)
asymptomatic Continue to monitor for symptoms

## 2018-04-05 NOTE — Assessment & Plan Note (Signed)
Overall controlled Current regimen effective and well tolerated Continue current medications at current doses Will check cmp at next visit Monitor BP at home

## 2018-04-08 ENCOUNTER — Other Ambulatory Visit: Payer: Self-pay
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
## 2018-04-25 ENCOUNTER — Ambulatory Visit (INDEPENDENT_AMBULATORY_CARE_PROVIDER_SITE_OTHER): Payer: Medicare Other | Admitting: Cardiology

## 2018-04-25 ENCOUNTER — Other Ambulatory Visit: Payer: Self-pay | Admitting: Internal Medicine

## 2018-04-25 ENCOUNTER — Encounter: Payer: Self-pay | Admitting: Cardiology

## 2018-04-25 VITALS — BP 128/72 | HR 72 | Ht 67.0 in | Wt 120.0 lb

## 2018-04-25 DIAGNOSIS — I359 Nonrheumatic aortic valve disorder, unspecified: Secondary | ICD-10-CM

## 2018-04-25 DIAGNOSIS — I10 Essential (primary) hypertension: Secondary | ICD-10-CM | POA: Diagnosis not present

## 2018-04-25 NOTE — Patient Instructions (Signed)
Medication Instructions:  NO CHANGE If you need a refill on your cardiac medications before your next appointment, please call your pharmacy.   Lab work: If you have labs (blood work) drawn today and your tests are completely normal, you will receive your results only by: Marland Kitchen. MyChart Message (if you have MyChart) OR . A paper copy in the mail If you have any lab test that is abnormal or we need to change your treatment, we will call you to review the results.  Testing/Procedures: Your physician has requested that you have an echocardiogram. Echocardiography is a painless test that uses sound waves to create images of your heart. It provides your doctor with information about the size and shape of your heart and how well your heart's chambers and valves are working. This procedure takes approximately one hour. There are no restrictions for this procedure.    Follow-Up: At [facility], you and your health needs are our priority.  As part of our continuing mission to provide you with exceptional heart care, we have created designated Provider Care Teams.  These Care Teams include your primary Cardiologist (physician) and Advanced Practice Providers (APPs -  Physician Assistants and Nurse Practitioners) who all work together to provide you with the care you need, when you need it. You will need a follow up appointment in 12 months.  Please call our office 2 months in advance to schedule this appointment.  You may see Olga MillersBRIAN CRENSHAW MD or one of the following Advanced Practice Providers on your designated Care Team:   Corine ShelterLuke Kilroy, PA-C Judy PimpleKrista Kroeger, [place] JerseyPA-C . Marjie Skiffallie Goodrich, PA-C

## 2018-05-03 ENCOUNTER — Other Ambulatory Visit: Payer: Self-pay

## 2018-05-03 ENCOUNTER — Ambulatory Visit (HOSPITAL_COMMUNITY): Payer: Medicare Other | Attending: Internal Medicine

## 2018-05-03 DIAGNOSIS — I359 Nonrheumatic aortic valve disorder, unspecified: Secondary | ICD-10-CM | POA: Insufficient documentation

## 2018-09-14 ENCOUNTER — Other Ambulatory Visit: Payer: Self-pay

## 2018-09-14 MED ORDER — METOPROLOL TARTRATE 25 MG PO TABS: 37.5000 mg | ORAL_TABLET | Freq: Two times a day (BID) | ORAL | 2 refills | Status: DC

## 2018-10-01 ENCOUNTER — Other Ambulatory Visit: Payer: Self-pay | Admitting: Internal Medicine

## 2018-10-04 ENCOUNTER — Ambulatory Visit: Payer: Medicare Other | Admitting: Internal Medicine

## 2018-10-26 ENCOUNTER — Other Ambulatory Visit: Payer: Self-pay | Admitting: Internal Medicine

## 2018-11-08 ENCOUNTER — Ambulatory Visit: Payer: Medicare Other | Admitting: Internal Medicine

## 2018-11-18 ENCOUNTER — Other Ambulatory Visit: Payer: Self-pay | Admitting: Internal Medicine

## 2018-12-13 ENCOUNTER — Telehealth: Payer: Self-pay | Admitting: Internal Medicine

## 2018-12-13 NOTE — Telephone Encounter (Signed)
Called patient to schedule AWV, no answer. Will try to call patient back at later time. SF °

## 2018-12-31 ENCOUNTER — Other Ambulatory Visit: Payer: Self-pay | Admitting: Internal Medicine

## 2019-01-26 ENCOUNTER — Other Ambulatory Visit: Payer: Self-pay | Admitting: Internal Medicine

## 2019-02-01 ENCOUNTER — Other Ambulatory Visit: Payer: Self-pay | Admitting: Internal Medicine

## 2019-02-15 DIAGNOSIS — Z119 Encounter for screening for infectious and parasitic diseases, unspecified: Secondary | ICD-10-CM | POA: Diagnosis not present

## 2019-02-23 ENCOUNTER — Telehealth: Payer: Self-pay | Admitting: *Deleted

## 2019-02-23 ENCOUNTER — Other Ambulatory Visit: Payer: Self-pay | Admitting: Internal Medicine

## 2019-02-23 NOTE — Telephone Encounter (Signed)
I spoke to pt's daughter in law, [place]. I offered to schedule a virtual visit for pt with Dr. Quay Burow. Since patient is at [facility], she needs to check with facility on times and if someone there will be available to help pt with visit. I informed Jenny Reichmann to call us back when she is ready to schedule a f/u for med refills.

## 2019-02-23 NOTE — Telephone Encounter (Signed)
Copied from [place]. Topic: General - Other >> Feb 23, 2019 12:29 PM Carolyn Stare wrote: Pt need a ov according to the note in Epic to get a refill on enalapril (VASOTEC) 20 MG tablet,  Pt daughter in law said pt is in [place] and if she leave she would have to be quantine and is asking if the appt can be virtual

## 2019-02-28 ENCOUNTER — Other Ambulatory Visit: Payer: Self-pay | Admitting: Emergency Medicine

## 2019-02-28 MED ORDER — ENALAPRIL MALEATE 20 MG PO TABS: 20.0000 mg | ORAL_TABLET | Freq: Two times a day (BID) | ORAL | 0 refills | Status: DC

## 2019-03-01 ENCOUNTER — Ambulatory Visit: Payer: Medicare Other | Admitting: Internal Medicine

## 2019-03-02 NOTE — Progress Notes (Signed)
Virtual Visit via Video Note  I connected with Antony Contras on 03/03/19 at  3:15 PM EDT by a video enabled telemedicine application and verified that I am speaking with the correct person using two identifiers.   I discussed the limitations of evaluation and management by telemedicine and the availability of in person appointments. The patient expressed understanding and agreed to proceed.  The patient is currently at home and I am in the office.    No referring provider.    History of Present Illness: She is here for follow up of her chronic medical conditions.  Overall she feels well and has no concerns.  She denies any changes in her health since she was here last.  She is eating well.  She does PT twice a week at [facility].    Aortic insufficiency, Hypertension: She is taking her medication daily. She is compliant with a low sodium diet.  She denies chest pain, palpitations, edema, shortness of breath and regular headaches.  She had her blood pressure checked there today and it was 130/60 and her heart rate was 90.  Depression, difficult sleeping:  She is taking remeron nightly.  She is sleeping well.  She denies any depression or anxiety.   She is due for refills of her medications-sent to her pharmacy.  Review of Systems  Constitutional: Negative for chills and fever.  Respiratory: Negative for cough, shortness of breath and wheezing.   Cardiovascular: Negative for chest pain, palpitations and leg swelling.  Neurological: Negative for dizziness and headaches.  Psychiatric/Behavioral: Negative for depression. The patient has insomnia (Well-controlled, sleeping well). The patient is not nervous/anxious.       Social History   Socioeconomic History  . Marital status: Widowed    Spouse name: Not on file  . Number of children: Not on file  . Years of education: Not on file  . Highest education level: Not on file  Occupational History  . Not on file  Social Needs  .  Financial resource strain: Not on file  . Food insecurity    Worry: Not on file    Inability: Not on file  . Transportation needs    Medical: Not on file    Non-medical: Not on file  Tobacco Use  . Smoking status: Former Smoker    Types: Cigarettes    Quit date: 05/25/1960    Years since quitting: 58.8  . Smokeless tobacco: Never Used  Substance and Sexual Activity  . Alcohol use: No  . Drug use: No  . Sexual activity: Not on file  Lifestyle  . Physical activity    Days per week: Not on file    Minutes per session: Not on file  . Stress: Not on file  Relationships  . Social Musician on phone: Not on file    Gets together: Not on file    Attends religious service: Not on file    Active member of club or organization: Not on file    Attends meetings of clubs or organizations: Not on file    Relationship status: Not on file  Other Topics Concern  . Not on file  Social History Narrative   She lives alone in [place]. Widowed in 1995 after married to spouse x 23yrs. 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 Has 2 sons, one still living in [place])     Observations/Objective: Appears well in NAD Breathing normally mood and affect normal  Assessment  and Plan:  See Problem List for Assessment and Plan of chronic medical problems.   Follow Up Instructions:  Overall doing quite well and very stable.  Unfortunately we are not able to check blood work at this time-that is not worth her leaving her assisted living facility.  Her blood work has been relatively stable so we will hold off and hopefully do them in 6 months, sooner if needed.    I discussed the assessment and treatment plan with the patient. The patient was provided an opportunity to ask questions and all were answered. The patient agreed with the plan and demonstrated an understanding of the instructions.   The patient was advised to call back or seek an in-person evaluation if the  symptoms worsen or if the condition fails to improve as anticipated.  Follow-up in 6 months, sooner if needed  Binnie Rail, MD

## 2019-03-03 ENCOUNTER — Ambulatory Visit (INDEPENDENT_AMBULATORY_CARE_PROVIDER_SITE_OTHER): Payer: Medicare Other | Admitting: Internal Medicine

## 2019-03-03 ENCOUNTER — Encounter: Payer: Self-pay | Admitting: Internal Medicine

## 2019-03-03 DIAGNOSIS — F4323 Adjustment disorder with mixed anxiety and depressed mood: Secondary | ICD-10-CM

## 2019-03-03 DIAGNOSIS — R5381 Other malaise: Secondary | ICD-10-CM

## 2019-03-03 DIAGNOSIS — I351 Nonrheumatic aortic (valve) insufficiency: Secondary | ICD-10-CM | POA: Diagnosis not present

## 2019-03-03 DIAGNOSIS — I10 Essential (primary) hypertension: Secondary | ICD-10-CM

## 2019-03-03 DIAGNOSIS — R2689 Other abnormalities of gait and mobility: Secondary | ICD-10-CM | POA: Diagnosis not present

## 2019-03-03 MED ORDER — ENALAPRIL MALEATE 20 MG PO TABS: 20.0000 mg | ORAL_TABLET | Freq: Two times a day (BID) | ORAL | 1 refills | Status: DC

## 2019-03-03 MED ORDER — MIRTAZAPINE 15 MG PO TABS: 15.0000 mg | ORAL_TABLET | Freq: Every day | ORAL | 1 refills | Status: DC

## 2019-03-03 MED ORDER — METOPROLOL TARTRATE 25 MG PO TABS: 37.5000 mg | ORAL_TABLET | Freq: Two times a day (BID) | ORAL | 1 refills | Status: DC

## 2019-03-03 NOTE — Assessment & Plan Note (Signed)
Continue physical therapy twice a week

## 2019-03-03 NOTE — Assessment & Plan Note (Signed)
Continue physical therapy twice a week 

## 2019-03-03 NOTE — Assessment & Plan Note (Signed)
Asymptomatic-no lightheadedness, chest pain, shortness of breath, no falls Unfortunately I am not able to listen to her heart, but we will plan on a follow-up in 6 months Continue to monitor for symptoms

## 2019-03-03 NOTE — Assessment & Plan Note (Signed)
History of anxiety, depression and sleep difficulty due to aging changes, death of a friend Has been taking mirtazapine 15 mg at bedtime and has been doing well Denies any depression, anxiety and states her sleep is good We will continue current dose

## 2019-03-03 NOTE — Assessment & Plan Note (Signed)
Blood pressure today at her assisted living facility 130/60, heart rate 90 Blood pressure well controlled Continue current medications at current doses Follow-up in 6 months, sooner if needed

## 2019-04-18 ENCOUNTER — Other Ambulatory Visit: Payer: Self-pay

## 2019-06-01 ENCOUNTER — Other Ambulatory Visit: Payer: Self-pay | Admitting: Internal Medicine
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
## 2019-06-09 ENCOUNTER — Telehealth (INDEPENDENT_AMBULATORY_CARE_PROVIDER_SITE_OTHER): Payer: Medicare Other | Admitting: Cardiology

## 2019-06-09 VITALS — Ht 66.0 in | Wt 120.0 lb

## 2019-06-09 DIAGNOSIS — I359 Nonrheumatic aortic valve disorder, unspecified: Secondary | ICD-10-CM

## 2019-06-09 DIAGNOSIS — I10 Essential (primary) hypertension: Secondary | ICD-10-CM | POA: Diagnosis not present

## 2019-06-09 NOTE — Patient Instructions (Signed)

## 2019-06-15 DIAGNOSIS — Z23 Encounter for immunization: Secondary | ICD-10-CM | POA: Diagnosis not present

## 2019-06-21 ENCOUNTER — Other Ambulatory Visit: Payer: Self-pay

## 2019-06-21 NOTE — Telephone Encounter (Signed)
New message   1. What are your last 5 BP readings?   1/14  167/83 1/12  188/83 1/19  188/85  1/21  173/79 1/26  179/85   2. Are you having any other symptoms (ex. Dizziness, headache, blurred vision, passed out)?  No   3. What is your BP issue? [facility] has some concern on blood pressure  Please advise

## 2019-06-21 NOTE — Telephone Encounter (Signed)
Please advise 

## 2019-06-21 NOTE — Telephone Encounter (Signed)
Lets start hydralazine 25 mg twice daily.  If they can continue to monitor her blood pressure and send [place] readings in a week or so that will be great.  Prescription pending

## 2019-06-22 MED ORDER — HYDRALAZINE HCL 25 MG PO TABS: 25.0000 mg | ORAL_TABLET | Freq: Two times a day (BID) | ORAL | 5 refills | Status: DC

## 2019-06-28 ENCOUNTER — Telehealth: Payer: Self-pay | Admitting: Cardiology

## 2019-06-28 MED ORDER — HYDRALAZINE HCL 25 MG PO TABS: 25.0000 mg | ORAL_TABLET | Freq: Three times a day (TID) | ORAL | 3 refills | Status: DC

## 2019-06-28 NOTE — Telephone Encounter (Signed)
Would begin hydralazine 25 mg 3 times daily.  Follow blood pressure and adjust as needed. Olga Millers

## 2019-06-28 NOTE — Telephone Encounter (Signed)
Spoke with pt daughter, she was given hydralazine by her medical doctor twice daily. They just picked up the prescription today. They will start 25 mg three times daily and follow her bp. They will let us know when they need a refill.

## 2019-06-28 NOTE — Telephone Encounter (Signed)
Spoke with pt's daughter in [place], [place], [place] on file.  She calls in concerned about pt's elevated BPs.    2/2- 192/87, 213/97 1/28- 170/82, 179/85- these were pre and post exercise.  Pt walks down the hallway and back with PT. 1/26- 179/85  Pt gets BP checked twice a week by [facility] when she comes to do PT.  Unsure of salt intake but she does know that pt is on a standard diet at [facility] so is able to order whatever she likes.  Daughter in law last seen pt 3 weeks ago and didn't notice any swelling or SOB and pt felt fine.  Pt still currently feeling great.  Daughter in law does mention that pt does not hydrate well and they have to encourage her to drink 2-3 glasses of water a day.    Reviewed medications with daughter in law and she said pt was only take Enalapril 20mg  BID and Metoprolol Tartrate 37.5mg  BID.  She was unaware of pt being prescribed Hydralazine.  This was a new script sent in by PCP on 1/27.  2/27 states that they always pick up her medications and sort them for her and they have not gotten the Hydralazine.  She plans to pick this up today.  I spoke with the pharmacy just to check on the status of the Hydralazine and they said it was picked up on 1/28.  Tried to call the pt to see if she has been taking, no answer.  Tried to call Cindy back to update but received VM.  Will route to Dr. 2/28.

## 2019-06-28 NOTE — Telephone Encounter (Signed)
New message  Pt c/o BP issue: STAT if pt c/o blurred vision, one-sided weakness or slurred speech  1. What are your last 5 BP readings?192/87   213/97  170/82 179/85   179/85  2. Are you having any other symptoms (ex. Dizziness, headache, blurred vision, passed out)? No   3. What is your BP issue?Patient's b/p is elevated

## 2019-07-13 DIAGNOSIS — Z23 Encounter for immunization: Secondary | ICD-10-CM | POA: Diagnosis not present

## 2019-08-09 ENCOUNTER — Emergency Department (HOSPITAL_COMMUNITY)
Admission: EM | Admit: 2019-08-09 | Discharge: 2019-08-10 | Disposition: A | Discharge status: Home / Self Care | Payer: Medicare Other | Attending: Emergency Medicine | Admitting: Emergency Medicine

## 2019-08-09 ENCOUNTER — Encounter (HOSPITAL_COMMUNITY): Payer: Self-pay

## 2019-08-09 DIAGNOSIS — R799 Abnormal finding of blood chemistry, unspecified: Secondary | ICD-10-CM | POA: Diagnosis not present

## 2019-08-09 DIAGNOSIS — Z79899 Other long term (current) drug therapy: Secondary | ICD-10-CM | POA: Insufficient documentation

## 2019-08-09 DIAGNOSIS — Z87891 Personal history of nicotine dependence: Secondary | ICD-10-CM | POA: Insufficient documentation

## 2019-08-09 DIAGNOSIS — R7989 Other specified abnormal findings of blood chemistry: Secondary | ICD-10-CM | POA: Diagnosis not present

## 2019-08-09 DIAGNOSIS — I10 Essential (primary) hypertension: Secondary | ICD-10-CM | POA: Insufficient documentation

## 2019-08-09 DIAGNOSIS — R000 Tachycardia, unspecified: Secondary | ICD-10-CM | POA: Insufficient documentation

## 2019-08-09 NOTE — ED Triage Notes (Signed)
Pt states that she took her BP at home and the is was 150 systolic and she has been complaint with her medication, pt has no other complaints.

## 2019-08-09 NOTE — ED Notes (Signed)
Please call son Setsuko Robins @ [phone]--as son as patient get in room--Beata Beason

## 2019-08-10 ENCOUNTER — Telehealth: Payer: Self-pay | Admitting: *Deleted

## 2019-08-10 LAB — I-STAT CHEM 8, ED
BUN: 42 mg/dL — ABNORMAL HIGH (ref 8–23)
Calcium, Ion: 1.34 mmol/L (ref 1.15–1.40)
Chloride: 108 mmol/L (ref 98–111)
Creatinine, Ser: 1.3 mg/dL — ABNORMAL HIGH (ref 0.44–1.00)
Glucose, Bld: 97 mg/dL (ref 70–99)
HCT: 42 % (ref 36.0–46.0)
Hemoglobin: 14.3 g/dL (ref 12.0–15.0)
Potassium: 4.1 mmol/L (ref 3.5–5.1)
Sodium: 144 mmol/L (ref 135–145)
TCO2: 28 mmol/L (ref 22–32)

## 2019-08-10 MED ORDER — ASPIRIN EC 325 MG PO TBEC: 325.0000 mg | DELAYED_RELEASE_TABLET | Freq: Every day | ORAL | 0 refills | Status: AC
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
## 2019-08-10 NOTE — Telephone Encounter (Signed)
-----   Message from Lewayne Bunting, MD sent at 08/10/2019  7:14 AM EDT ----- Regarding: FW: ER patient Schedule paov Olga Millers ----- Message ----- From: Marily Memos, MD Sent: 08/10/2019   1:28 AM EDT To: Lewayne Bunting, MD Subject: ER patient                                     Dr. Jens Som,  I saw Olivia Gay in the emergency room tonight.  Apparently the nurse was taking her vital signs at the facility and found her to have a heart rate ranging from 150-165 and sent her here for further evaluation.  Here she has been in a sinus rhythm.  She states that she was little bit weak while her heart rate was fast but no other symptoms.  She does have a history of aortic insufficiency so there is some concern for possible paroxysmal atrial fibrillation.  As she is 69 and in an assisted living I did not know if starting her on anticoagulants was appropriate this time so I staredt her on full dose aspirin and asked her and her son to call your office for follow-up for possible monitoring or conversations regarding the risks and benefits of anticoagulation versus further work-up versus doing nothing.  Thank you for your help.Olivia Gay

## 2019-08-10 NOTE — Telephone Encounter (Signed)
Unable to reach pt or leave a message mailbox is not set up. 

## 2019-08-11 ENCOUNTER — Telehealth: Payer: Self-pay | Admitting: Cardiology

## 2019-08-11 NOTE — Telephone Encounter (Signed)
Spoke with pt daughter-n-law, Follow up scheduled

## 2019-08-11 NOTE — Telephone Encounter (Signed)
Patients daughter in law is following up.  

## 2019-08-11 NOTE — Telephone Encounter (Signed)
error 
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
## 2019-08-17 ENCOUNTER — Other Ambulatory Visit: Payer: Self-pay

## 2019-08-17 ENCOUNTER — Ambulatory Visit (INDEPENDENT_AMBULATORY_CARE_PROVIDER_SITE_OTHER): Payer: Medicare Other | Admitting: General Practice

## 2019-08-17 ENCOUNTER — Encounter: Payer: Self-pay | Admitting: General Practice

## 2019-08-17 VITALS — BP 99/51 | HR 75 | Ht 66.0 in | Wt 110.0 lb

## 2019-08-17 DIAGNOSIS — I10 Essential (primary) hypertension: Secondary | ICD-10-CM

## 2019-08-17 DIAGNOSIS — I479 Paroxysmal tachycardia, unspecified: Secondary | ICD-10-CM

## 2019-08-17 DIAGNOSIS — I359 Nonrheumatic aortic valve disorder, unspecified: Secondary | ICD-10-CM | POA: Diagnosis not present

## 2019-08-17 NOTE — Patient Instructions (Signed)
Medication Instructions:  The current medical regimen is effective;  continue present plan and medications as directed. Please refer to the Current Medication list given to you today. *If you need a refill on your cardiac medications before your next appointment, please call your pharmacy*  Special Instructions INCREASE HYDRATION  INCREASE PHYSICAL ACTIVITY AS TOLERATED  TAKE AN LOG YOUR BLOOD PRESSURE 2 TIMES A WEEK AND BRING LOG WITH YOU TO FOLLOW UP APPOINTMENT  RECOMMEND TO GET LIFE ALERT BUTTON  Follow-Up: Your next appointment:  3 month(s)  In Person with Olga Millers, MD  At [facility], you and your health needs are our priority.  As part of our continuing mission to provide you with exceptional heart care, we have created designated Provider Care Teams.  These Care Teams include your primary Cardiologist (physician) and Advanced Practice Providers (APPs -  Physician Assistants and Nurse Practitioners) who all work together to provide you with the care you need, when you need it.  We recommend signing up for the patient portal called "MyChart".  Sign up information is provided on this After Visit Summary.  MyChart is used to connect with patients for Virtual Visits (Telemedicine).  Patients are able to view lab/test results, encounter notes, upcoming appointments, etc.  Non-urgent messages can be sent to your provider as well.   To learn more about what you can do with MyChart, go to [email or web address].

## 2019-08-28 ENCOUNTER — Other Ambulatory Visit: Payer: Self-pay | Admitting: Cardiology

## 2019-08-28 MED ORDER — HYDRALAZINE HCL 25 MG PO TABS: 25.0000 mg | ORAL_TABLET | Freq: Three times a day (TID) | ORAL | 3 refills | Status: DC

## 2019-08-28 NOTE — Telephone Encounter (Signed)
New Message     *STAT* If patient is at the pharmacy, call can be transferred to refill team.   1. Which medications need to be refilled? (please list name of each medication and dose if known) hydrALAZINE (APRESOLINE) 25 MG tablet Take 1 tablet (25 mg total) by mouth 3 (three) times daily.   2. Which pharmacy/location (including street and city if local pharmacy) is medication to be sent to? [id number] - [place], [place] - [address]  [address], [place] [address]   3. Do they need a 30 day or 90 day supply? 41    New RX needs to be sent to the pharmacy

## 2019-08-28 NOTE — Telephone Encounter (Signed)
Refill complete 

## 2019-09-07 ENCOUNTER — Other Ambulatory Visit: Payer: Self-pay

## 2019-09-07 ENCOUNTER — Ambulatory Visit (INDEPENDENT_AMBULATORY_CARE_PROVIDER_SITE_OTHER): Payer: Medicare Other

## 2019-09-07 VITALS — BP 130/80 | HR 64 | Temp 98.2°F | Resp 16 | Ht 66.0 in | Wt 112.6 lb

## 2019-09-07 DIAGNOSIS — Z0000 Encounter for general adult medical examination without abnormal findings: Secondary | ICD-10-CM | POA: Diagnosis not present
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
## 2019-09-07 NOTE — Patient Instructions (Addendum)
Olivia Gay , Thank you for taking time to come for your Medicare Wellness Visit. I appreciate your ongoing commitment to your health goals. Please review the following plan we discussed and let me know if I can assist you in the future.   Screening recommendations/referrals: Colorectal Screening: not recommended due to age 86: not recommended due to age Bone Density: not recommended due to age  41 and Dental Exams: Recommended annual ophthalmology exams for early detection of glaucoma and other disorders of the eye Recommended annual dental exams for proper oral hygiene  Vaccinations: Influenza vaccine: 02/16/2019 Pneumococcal vaccine: completed; Pneumovax 06/23/2012 and Prevnar 03/08/2017 Tdap vaccine: Declined Shingles vaccine: Please call your insurance company to determine your out of pocket expense for the Shingrix vaccine. You may receive this vaccine at your local pharmacy.  Advanced directives: Advance directives discussed with you today.Please bring a copy of your POA (Power of [place]) and/or Living Will to your next appointment.  Goals:  Recommend to drink at least 6-8 8oz glasses of water per day.  Recommend to exercise for at least 150 minutes per week.  Recommend to remove any items from the home that may cause slips or trips.  Recommend to decrease portion sizes by eating 3 small healthy meals and at least 2 healthy snacks per day.  Recommend to begin DASH diet as directed below  Recommend to continue efforts to reduce smoking habits until no longer smoking. Smoking Cessation literature is attached below.  Next appointment: Please schedule your Annual Wellness Visit with your Nurse Health Advisor in one year.  Preventive Care 65 Years and Older, Female Preventive care refers to lifestyle choices and visits with your health care provider that can promote health and wellness. What does preventive care include?  A yearly physical exam. This is also  called an annual well check.  Dental exams once or twice a year.  Routine eye exams. Ask your health care provider how often you should have your eyes checked.  Personal lifestyle choices, including:  Daily care of your teeth and gums.  Regular physical activity.  Eating a healthy diet.  Avoiding tobacco and drug use.  Limiting alcohol use.  Practicing safe sex.  Taking low-dose aspirin every day if recommended by your health care provider.  Taking vitamin and mineral supplements as recommended by your health care provider. What happens during an annual well check? The services and screenings done by your health care provider during your annual well check will depend on your age, overall health, lifestyle risk factors, and family history of disease. Counseling  Your health care provider may ask you questions about your:  Alcohol use.  Tobacco use.  Drug use.  Emotional well-being.  Home and relationship well-being.  Sexual activity.  Eating habits.  History of falls.  Memory and ability to understand (cognition).  Work and work Statistician.  Reproductive health. Screening  You may have the following tests or measurements:  Height, weight, and BMI.  Blood pressure.  Lipid and cholesterol levels. These may be checked every 5 years, or more frequently if you are over 36 years old.  Skin check.  Lung cancer screening. You may have this screening every year starting at age 39 if you have a 30-pack-year history of smoking and currently smoke or have quit within the past 15 years.  Fecal occult blood test (FOBT) of the stool. You may have this test every year starting at age 52.  Flexible sigmoidoscopy or colonoscopy. You may have a sigmoidoscopy  every 5 years or a colonoscopy every 10 years starting at age 74.  Hepatitis C blood test.  Hepatitis B blood test.  Sexually transmitted disease (STD) testing.  Diabetes screening. This is done by checking  your blood sugar (glucose) after you have not eaten for a while (fasting). You may have this done every 1-3 years.  Bone density scan. This is done to screen for osteoporosis. You may have this done starting at age 23.  Mammogram. This may be done every 1-2 years. Talk to your health care provider about how often you should have regular mammograms. Talk with your health care provider about your test results, treatment options, and if necessary, the need for more tests. Vaccines  Your health care provider may recommend certain vaccines, such as:  Influenza vaccine. This is recommended every year.  Tetanus, diphtheria, and acellular pertussis (Tdap, Td) vaccine. You may need a Td booster every 10 years.  Zoster vaccine. You may need this after age 85.  Pneumococcal 13-valent conjugate (PCV13) vaccine. One dose is recommended after age 70.  Pneumococcal polysaccharide (PPSV23) vaccine. One dose is recommended after age 52. Talk to your health care provider about which screenings and vaccines you need and how often you need them. This information is not intended to replace advice given to you by your health care provider. Make sure you discuss any questions you have with your health care provider. Document Released: 06/07/2015 Document Revised: 01/29/2016 Document Reviewed: 03/12/2015 Elsevier Interactive Patient Education  2017 [employer].  Fall Prevention in the Home Falls can cause injuries. They can happen to people of all ages. There are many things you can do to make your home safe and to help prevent falls. What can I do on the outside of my home?  Regularly fix the edges of walkways and driveways and fix any cracks.  Remove anything that might make you trip as you walk through a door, such as a raised step or threshold.  Trim any bushes or trees on the path to your home.  Use bright outdoor lighting.  Clear any walking paths of anything that might make someone trip, such as  rocks or tools.  Regularly check to see if handrails are loose or broken. Make sure that both sides of any steps have handrails.  Any raised decks and porches should have guardrails on the edges.  Have any leaves, snow, or ice cleared regularly.  Use sand or salt on walking paths during winter.  Clean up any spills in your garage right away. This includes oil or grease spills. What can I do in the bathroom?  Use night lights.  Install grab bars by the toilet and in the tub and shower. Do not use towel bars as grab bars.  Use non-skid mats or decals in the tub or shower.  If you need to sit down in the shower, use a plastic, non-slip stool.  Keep the floor dry. Clean up any water that spills on the floor as soon as it happens.  Remove soap buildup in the tub or shower regularly.  Attach bath mats securely with double-sided non-slip rug tape.  Do not have throw rugs and other things on the floor that can make you trip. What can I do in the bedroom?  Use night lights.  Make sure that you have a light by your bed that is easy to reach.  Do not use any sheets or blankets that are too big for your bed. They should  not hang down onto the floor.  Have a firm chair that has side arms. You can use this for support while you get dressed.  Do not have throw rugs and other things on the floor that can make you trip. What can I do in the kitchen?  Clean up any spills right away.  Avoid walking on wet floors.  Keep items that you use a lot in easy-to-reach places.  If you need to reach something above you, use a strong step stool that has a grab bar.  Keep electrical cords out of the way.  Do not use floor polish or wax that makes floors slippery. If you must use wax, use non-skid floor wax.  Do not have throw rugs and other things on the floor that can make you trip. What can I do with my stairs?  Do not leave any items on the stairs.  Make sure that there are handrails on  both sides of the stairs and use them. Fix handrails that are broken or loose. Make sure that handrails are as long as the stairways.  Check any carpeting to make sure that it is firmly attached to the stairs. Fix any carpet that is loose or worn.  Avoid having throw rugs at the top or bottom of the stairs. If you do have throw rugs, attach them to the floor with carpet tape.  Make sure that you have a light switch at the top of the stairs and the bottom of the stairs. If you do not have them, ask someone to add them for you. What else can I do to help prevent falls?  Wear shoes that:  Do not have high heels.  Have rubber bottoms.  Are comfortable and fit you well.  Are closed at the toe. Do not wear sandals.  If you use a stepladder:  Make sure that it is fully opened. Do not climb a closed stepladder.  Make sure that both sides of the stepladder are locked into place.  Ask someone to hold it for you, if possible.  Clearly mark and make sure that you can see:  Any grab bars or handrails.  First and last steps.  Where the edge of each step is.  Use tools that help you move around (mobility aids) if they are needed. These include:  Canes.  Walkers.  Scooters.  Crutches.  Turn on the lights when you go into a dark area. Replace any light bulbs as soon as they burn out.  Set up your furniture so you have a clear path. Avoid moving your furniture around.  If any of your floors are uneven, fix them.  If there are any pets around you, be aware of where they are.  Review your medicines with your doctor. Some medicines can make you feel dizzy. This can increase your chance of falling. Ask your doctor what other things that you can do to help prevent falls. This information is not intended to replace advice given to you by your health care provider. Make sure you discuss any questions you have with your health care provider. Document Released: 03/07/2009 Document  Revised: 10/17/2015 Document Reviewed: 06/15/2014 Elsevier Interactive Patient Education  2017 [employer].

## 2019-09-12 DIAGNOSIS — M6281 Muscle weakness (generalized): Secondary | ICD-10-CM | POA: Diagnosis not present

## 2019-09-14 ENCOUNTER — Other Ambulatory Visit: Payer: Self-pay | Admitting: Internal Medicine

## 2019-09-14 DIAGNOSIS — M6281 Muscle weakness (generalized): Secondary | ICD-10-CM | POA: Diagnosis not present

## 2019-09-19 DIAGNOSIS — M6281 Muscle weakness (generalized): Secondary | ICD-10-CM | POA: Diagnosis not present

## 2019-09-21 DIAGNOSIS — M6281 Muscle weakness (generalized): Secondary | ICD-10-CM | POA: Diagnosis not present

## 2019-09-26 DIAGNOSIS — M6281 Muscle weakness (generalized): Secondary | ICD-10-CM | POA: Diagnosis not present

## 2019-09-26 DIAGNOSIS — R2681 Unsteadiness on feet: Secondary | ICD-10-CM | POA: Diagnosis not present

## 2019-10-01 ENCOUNTER — Other Ambulatory Visit: Payer: Self-pay | Admitting: Internal Medicine

## 2019-10-03 DIAGNOSIS — R2681 Unsteadiness on feet: Secondary | ICD-10-CM | POA: Diagnosis not present

## 2019-10-03 DIAGNOSIS — M6281 Muscle weakness (generalized): Secondary | ICD-10-CM | POA: Diagnosis not present

## 2019-10-05 DIAGNOSIS — R2681 Unsteadiness on feet: Secondary | ICD-10-CM | POA: Diagnosis not present

## 2019-10-05 DIAGNOSIS — M6281 Muscle weakness (generalized): Secondary | ICD-10-CM | POA: Diagnosis not present

## 2019-10-10 DIAGNOSIS — R2681 Unsteadiness on feet: Secondary | ICD-10-CM | POA: Diagnosis not present

## 2019-10-10 DIAGNOSIS — M6281 Muscle weakness (generalized): Secondary | ICD-10-CM | POA: Diagnosis not present

## 2019-10-12 DIAGNOSIS — R2681 Unsteadiness on feet: Secondary | ICD-10-CM | POA: Diagnosis not present

## 2019-10-12 DIAGNOSIS — M6281 Muscle weakness (generalized): Secondary | ICD-10-CM | POA: Diagnosis not present

## 2019-10-17 DIAGNOSIS — R2681 Unsteadiness on feet: Secondary | ICD-10-CM | POA: Diagnosis not present

## 2019-10-17 DIAGNOSIS — M6281 Muscle weakness (generalized): Secondary | ICD-10-CM | POA: Diagnosis not present

## 2019-10-26 ENCOUNTER — Other Ambulatory Visit: Payer: Self-pay | Admitting: Internal Medicine

## 2019-11-30 ENCOUNTER — Other Ambulatory Visit: Payer: Self-pay | Admitting: Internal Medicine

## 2019-12-12 ENCOUNTER — Other Ambulatory Visit: Payer: Self-pay | Admitting: Internal Medicine

## 2020-01-05 ENCOUNTER — Other Ambulatory Visit: Payer: Self-pay | Admitting: Internal Medicine
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
## 2020-01-11 NOTE — Patient Instructions (Addendum)
  Blood work was ordered.     Medications reviewed and updated.  Changes include :   None   Your prescription(s) have been submitted to your pharmacy. Please take as directed and contact our office if you believe you are having problem(s) with the medication(s).    Please followup in 1 year  

## 2020-01-12 ENCOUNTER — Other Ambulatory Visit: Payer: Self-pay

## 2020-01-12 ENCOUNTER — Ambulatory Visit (INDEPENDENT_AMBULATORY_CARE_PROVIDER_SITE_OTHER): Payer: Medicare Other | Admitting: Internal Medicine

## 2020-01-12 ENCOUNTER — Encounter: Payer: Self-pay | Admitting: Internal Medicine

## 2020-01-12 VITALS — BP 110/70 | HR 66 | Temp 98.1°F | Ht 66.0 in | Wt 108.0 lb

## 2020-01-12 DIAGNOSIS — I10 Essential (primary) hypertension: Secondary | ICD-10-CM

## 2020-01-12 DIAGNOSIS — R2689 Other abnormalities of gait and mobility: Secondary | ICD-10-CM

## 2020-01-12 DIAGNOSIS — F4323 Adjustment disorder with mixed anxiety and depressed mood: Secondary | ICD-10-CM

## 2020-01-12 LAB — CBC WITH DIFFERENTIAL/PLATELET
Absolute Monocytes: 458 cells/uL (ref 200–950)
Basophils Absolute: 18 cells/uL (ref 0–200)
Basophils Relative: 0.3 %
Eosinophils Absolute: 12 cells/uL — ABNORMAL LOW (ref 15–500)
Eosinophils Relative: 0.2 %
HCT: 39.6 % (ref 35.0–45.0)
Hemoglobin: 13.1 g/dL (ref 11.7–15.5)
Lymphs Abs: 1275 cells/uL (ref 850–3900)
MCH: 28.1 pg (ref 27.0–33.0)
MCHC: 33.1 g/dL (ref 32.0–36.0)
MCV: 85 fL (ref 80.0–100.0)
MPV: 10.3 fL (ref 7.5–12.5)
Monocytes Relative: 7.5 %
Neutro Abs: 4337 cells/uL (ref 1500–7800)
Neutrophils Relative %: 71.1 %
Platelets: 184 10*3/uL (ref 140–400)
RBC: 4.66 10*6/uL (ref 3.80–5.10)
RDW: 13.4 % (ref 11.0–15.0)
Total Lymphocyte: 20.9 %
WBC: 6.1 10*3/uL (ref 3.8–10.8)

## 2020-01-12 LAB — COMPLETE METABOLIC PANEL WITH GFR
AG Ratio: 2.1 (calc) (ref 1.0–2.5)
ALT: 10 U/L (ref 6–29)
AST: 19 U/L (ref 10–35)
Albumin: 4.2 g/dL (ref 3.6–5.1)
Alkaline phosphatase (APISO): 59 U/L (ref 37–153)
BUN/Creatinine Ratio: 24 (calc) — ABNORMAL HIGH (ref 6–22)
BUN: 31 mg/dL — ABNORMAL HIGH (ref 7–25)
CO2: 26 mmol/L (ref 20–32)
Calcium: 9.7 mg/dL (ref 8.6–10.4)
Chloride: 108 mmol/L (ref 98–110)
Creat: 1.27 mg/dL — ABNORMAL HIGH (ref 0.60–0.88)
GFR, Est African American: 43 mL/min/{1.73_m2} — ABNORMAL LOW (ref >=60)
GFR, Est Non African American: 37 mL/min/{1.73_m2} — ABNORMAL LOW (ref >=60)
Globulin: 2 g/dL (calc) (ref 1.9–3.7)
Glucose, Bld: 103 mg/dL — ABNORMAL HIGH (ref 65–99)
Potassium: 4.4 mmol/L (ref 3.5–5.3)
Sodium: 142 mmol/L (ref 135–146)
Total Bilirubin: 0.6 mg/dL (ref 0.2–1.2)
Total Protein: 6.2 g/dL (ref 6.1–8.1)

## 2020-01-12 MED ORDER — ENALAPRIL MALEATE 20 MG PO TABS: 20.0000 mg | ORAL_TABLET | Freq: Two times a day (BID) | ORAL | 3 refills | Status: DC

## 2020-01-12 MED ORDER — METOPROLOL TARTRATE 25 MG PO TABS: 37.5000 mg | ORAL_TABLET | Freq: Two times a day (BID) | ORAL | 3 refills | Status: DC

## 2020-01-12 MED ORDER — MIRTAZAPINE 15 MG PO TABS: ORAL_TABLET | ORAL | 3 refills | Status: DC

## 2020-01-12 NOTE — Assessment & Plan Note (Signed)
Chronic Controlled, stable Continue current dose of medication  

## 2020-01-12 NOTE — Assessment & Plan Note (Signed)
Chronic continue using walker Continue PT  2/wk

## 2020-01-12 NOTE — Assessment & Plan Note (Signed)
Chronic Blood pressure here well controlled Blood pressure at home has been variable, but given her age I will not make any changes  Continue current medications at current doses

## 2020-01-18 ENCOUNTER — Telehealth: Payer: Self-pay

## 2020-01-18 NOTE — Telephone Encounter (Signed)
Lab results given to patient this morning. Unable to document under results due to the way they were sent.

## 2020-02-20 DIAGNOSIS — Z23 Encounter for immunization: Secondary | ICD-10-CM | POA: Diagnosis not present

## 2020-04-03 DIAGNOSIS — Z23 Encounter for immunization: Secondary | ICD-10-CM | POA: Diagnosis not present

## 2020-07-26 DIAGNOSIS — R2681 Unsteadiness on feet: Secondary | ICD-10-CM | POA: Diagnosis not present

## 2020-07-26 DIAGNOSIS — M6281 Muscle weakness (generalized): Secondary | ICD-10-CM | POA: Diagnosis not present

## 2020-07-30 DIAGNOSIS — M6281 Muscle weakness (generalized): Secondary | ICD-10-CM | POA: Diagnosis not present

## 2020-07-30 DIAGNOSIS — R2681 Unsteadiness on feet: Secondary | ICD-10-CM | POA: Diagnosis not present

## 2020-08-06 DIAGNOSIS — M6281 Muscle weakness (generalized): Secondary | ICD-10-CM | POA: Diagnosis not present

## 2020-08-06 DIAGNOSIS — R2681 Unsteadiness on feet: Secondary | ICD-10-CM | POA: Diagnosis not present

## 2020-08-08 DIAGNOSIS — R2681 Unsteadiness on feet: Secondary | ICD-10-CM | POA: Diagnosis not present

## 2020-08-08 DIAGNOSIS — M6281 Muscle weakness (generalized): Secondary | ICD-10-CM | POA: Diagnosis not present

## 2020-08-09 ENCOUNTER — Other Ambulatory Visit: Payer: Self-pay | Admitting: Cardiology

## 2020-08-13 DIAGNOSIS — R2681 Unsteadiness on feet: Secondary | ICD-10-CM | POA: Diagnosis not present

## 2020-08-13 DIAGNOSIS — M6281 Muscle weakness (generalized): Secondary | ICD-10-CM | POA: Diagnosis not present

## 2020-08-20 DIAGNOSIS — R2681 Unsteadiness on feet: Secondary | ICD-10-CM | POA: Diagnosis not present

## 2020-08-20 DIAGNOSIS — M6281 Muscle weakness (generalized): Secondary | ICD-10-CM | POA: Diagnosis not present

## 2020-08-22 DIAGNOSIS — R2681 Unsteadiness on feet: Secondary | ICD-10-CM | POA: Diagnosis not present

## 2020-08-22 DIAGNOSIS — M6281 Muscle weakness (generalized): Secondary | ICD-10-CM | POA: Diagnosis not present

## 2020-08-27 DIAGNOSIS — M6281 Muscle weakness (generalized): Secondary | ICD-10-CM | POA: Diagnosis not present

## 2020-08-27 DIAGNOSIS — R2681 Unsteadiness on feet: Secondary | ICD-10-CM | POA: Diagnosis not present

## 2020-09-05 DIAGNOSIS — M6281 Muscle weakness (generalized): Secondary | ICD-10-CM | POA: Diagnosis not present

## 2020-09-05 DIAGNOSIS — R2681 Unsteadiness on feet: Secondary | ICD-10-CM | POA: Diagnosis not present

## 2020-09-10 DIAGNOSIS — M6281 Muscle weakness (generalized): Secondary | ICD-10-CM | POA: Diagnosis not present

## 2020-09-10 DIAGNOSIS — R2681 Unsteadiness on feet: Secondary | ICD-10-CM | POA: Diagnosis not present

## 2020-09-11 ENCOUNTER — Ambulatory Visit: Payer: Medicare Other

## 2020-09-12 DIAGNOSIS — M6281 Muscle weakness (generalized): Secondary | ICD-10-CM | POA: Diagnosis not present

## 2020-09-12 DIAGNOSIS — R2681 Unsteadiness on feet: Secondary | ICD-10-CM | POA: Diagnosis not present

## 2020-09-17 DIAGNOSIS — R2681 Unsteadiness on feet: Secondary | ICD-10-CM | POA: Diagnosis not present

## 2020-09-17 DIAGNOSIS — M6281 Muscle weakness (generalized): Secondary | ICD-10-CM | POA: Diagnosis not present

## 2020-09-19 DIAGNOSIS — M6281 Muscle weakness (generalized): Secondary | ICD-10-CM | POA: Diagnosis not present

## 2020-09-19 DIAGNOSIS — R2681 Unsteadiness on feet: Secondary | ICD-10-CM | POA: Diagnosis not present

## 2020-09-24 DIAGNOSIS — R2681 Unsteadiness on feet: Secondary | ICD-10-CM | POA: Diagnosis not present

## 2020-09-24 DIAGNOSIS — M6281 Muscle weakness (generalized): Secondary | ICD-10-CM | POA: Diagnosis not present

## 2020-09-26 DIAGNOSIS — M6281 Muscle weakness (generalized): Secondary | ICD-10-CM | POA: Diagnosis not present

## 2020-09-26 DIAGNOSIS — R2681 Unsteadiness on feet: Secondary | ICD-10-CM | POA: Diagnosis not present

## 2020-10-01 DIAGNOSIS — R2681 Unsteadiness on feet: Secondary | ICD-10-CM | POA: Diagnosis not present

## 2020-10-01 DIAGNOSIS — M6281 Muscle weakness (generalized): Secondary | ICD-10-CM | POA: Diagnosis not present

## 2020-10-03 DIAGNOSIS — R2681 Unsteadiness on feet: Secondary | ICD-10-CM | POA: Diagnosis not present

## 2020-10-03 DIAGNOSIS — M6281 Muscle weakness (generalized): Secondary | ICD-10-CM | POA: Diagnosis not present

## 2020-11-04 ENCOUNTER — Other Ambulatory Visit: Payer: Self-pay | Admitting: Cardiology

## 2021-01-04 ENCOUNTER — Other Ambulatory Visit: Payer: Self-pay | Admitting: Internal Medicine

## 2021-01-13 ENCOUNTER — Ambulatory Visit: Payer: Medicare Other | Admitting: Internal Medicine
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
## 2021-01-14 ENCOUNTER — Encounter: Payer: Self-pay | Admitting: Internal Medicine

## 2021-01-14 ENCOUNTER — Other Ambulatory Visit: Payer: Self-pay

## 2021-01-14 ENCOUNTER — Ambulatory Visit (INDEPENDENT_AMBULATORY_CARE_PROVIDER_SITE_OTHER): Payer: Medicare Other | Admitting: Internal Medicine

## 2021-01-14 VITALS — BP 108/72 | HR 65 | Temp 98.1°F | Ht 66.0 in | Wt 108.6 lb

## 2021-01-14 DIAGNOSIS — I10 Essential (primary) hypertension: Secondary | ICD-10-CM

## 2021-01-14 DIAGNOSIS — F4323 Adjustment disorder with mixed anxiety and depressed mood: Secondary | ICD-10-CM | POA: Diagnosis not present

## 2021-01-14 MED ORDER — MIRTAZAPINE 15 MG PO TABS: ORAL_TABLET | ORAL | 3 refills | Status: AC

## 2021-01-14 MED ORDER — METOPROLOL TARTRATE 25 MG PO TABS: 37.5000 mg | ORAL_TABLET | Freq: Two times a day (BID) | ORAL | 3 refills | Status: AC

## 2021-01-14 NOTE — Assessment & Plan Note (Signed)
Chronic Blood pressure well controlled here, but has been elevated fairly consistently lately when her physical therapist checks it She does have a nurse where she lives and will have her help monitor at the we can determine if it is controlled or not No change in medications today given her lower BP here today Continue enalapril 20 mg twice daily, hydralazine 25 mg 3 times daily and metoprolol 37.5 mg twice daily CMP, CBC

## 2021-01-14 NOTE — Patient Instructions (Addendum)
  Blood work was ordered.    Continue to monitor your BP at home.    Medications changes include :   none  Your prescription(s) have been submitted to your pharmacy. Please take as directed and contact our office if you believe you are having problem(s) with the medication(s).     Please followup in 1 year

## 2021-01-14 NOTE — Assessment & Plan Note (Signed)
Chronic Well-controlled Continue Remeron 15 mg nightly

## 2021-01-15 LAB — COMPREHENSIVE METABOLIC PANEL
ALT: 10 U/L (ref 0–35)
AST: 19 U/L (ref 0–37)
Albumin: 4.1 g/dL (ref 3.5–5.2)
Alkaline Phosphatase: 55 U/L (ref 39–117)
BUN: 32 mg/dL — ABNORMAL HIGH (ref 6–23)
CO2: 25 mEq/L (ref 19–32)
Calcium: 9.9 mg/dL (ref 8.4–10.5)
Chloride: 105 mEq/L (ref 96–112)
Creatinine, Ser: 1.33 mg/dL — ABNORMAL HIGH (ref 0.40–1.20)
GFR: 34.79 mL/min — ABNORMAL LOW (ref >60.00)
Glucose, Bld: 66 mg/dL — ABNORMAL LOW (ref 70–99)
Potassium: 4.3 mEq/L (ref 3.5–5.1)
Sodium: 141 mEq/L (ref 135–145)
Total Bilirubin: 0.6 mg/dL (ref 0.2–1.2)
Total Protein: 6.6 g/dL (ref 6.0–8.3)

## 2021-01-15 LAB — CBC WITH DIFFERENTIAL/PLATELET
Basophils Absolute: 0 10*3/uL (ref 0.0–0.1)
Basophils Relative: 0.6 % (ref 0.0–3.0)
Eosinophils Absolute: 0 10*3/uL (ref 0.0–0.7)
Eosinophils Relative: 0.3 % (ref 0.0–5.0)
HCT: 38 % (ref 36.0–46.0)
Hemoglobin: 12.7 g/dL (ref 12.0–15.0)
Lymphocytes Relative: 25.7 % (ref 12.0–46.0)
Lymphs Abs: 1.6 10*3/uL (ref 0.7–4.0)
MCHC: 33.5 g/dL (ref 30.0–36.0)
MCV: 84.2 fl (ref 78.0–100.0)
Monocytes Absolute: 0.5 10*3/uL (ref 0.1–1.0)
Monocytes Relative: 8.8 % (ref 3.0–12.0)
Neutro Abs: 3.9 10*3/uL (ref 1.4–7.7)
Neutrophils Relative %: 64.6 % (ref 43.0–77.0)
Platelets: 179 10*3/uL (ref 150.0–400.0)
RBC: 4.51 Mil/uL (ref 3.87–5.11)
RDW: 14.7 % (ref 11.5–15.5)
WBC: 6.1 10*3/uL (ref 4.0–10.5)

## 2021-01-29 ENCOUNTER — Other Ambulatory Visit: Payer: Self-pay | Admitting: Cardiology

## 2021-02-21 ENCOUNTER — Other Ambulatory Visit: Payer: Self-pay | Admitting: Cardiology

## 2021-02-26 ENCOUNTER — Encounter (HOSPITAL_COMMUNITY): Payer: Self-pay | Admitting: Emergency Medicine

## 2021-02-26 ENCOUNTER — Emergency Department (HOSPITAL_COMMUNITY)
Admission: EM | Admit: 2021-02-26 | Discharge: 2021-02-27 | Disposition: A | Discharge status: Skilled Nursing Facility | Payer: Medicare Other | Attending: Emergency Medicine | Admitting: Emergency Medicine

## 2021-02-26 ENCOUNTER — Other Ambulatory Visit: Payer: Self-pay

## 2021-02-26 ENCOUNTER — Emergency Department (HOSPITAL_COMMUNITY): Payer: Medicare Other

## 2021-02-26 DIAGNOSIS — S0990XA Unspecified injury of head, initial encounter: Secondary | ICD-10-CM

## 2021-02-26 DIAGNOSIS — S0093XA Contusion of unspecified part of head, initial encounter: Secondary | ICD-10-CM | POA: Diagnosis not present

## 2021-02-26 DIAGNOSIS — Z79899 Other long term (current) drug therapy: Secondary | ICD-10-CM | POA: Diagnosis not present

## 2021-02-26 DIAGNOSIS — Y9289 Other specified places as the place of occurrence of the external cause: Secondary | ICD-10-CM | POA: Diagnosis not present

## 2021-02-26 DIAGNOSIS — Z20822 Contact with and (suspected) exposure to covid-19: Secondary | ICD-10-CM | POA: Insufficient documentation

## 2021-02-26 DIAGNOSIS — I10 Essential (primary) hypertension: Secondary | ICD-10-CM | POA: Insufficient documentation

## 2021-02-26 DIAGNOSIS — Z87891 Personal history of nicotine dependence: Secondary | ICD-10-CM | POA: Diagnosis not present

## 2021-02-26 DIAGNOSIS — W19XXXA Unspecified fall, initial encounter: Secondary | ICD-10-CM | POA: Diagnosis not present

## 2021-02-26 DIAGNOSIS — W1809XA Striking against other object with subsequent fall, initial encounter: Secondary | ICD-10-CM | POA: Diagnosis not present

## 2021-02-26 DIAGNOSIS — R55 Syncope and collapse: Secondary | ICD-10-CM | POA: Diagnosis not present

## 2021-02-26 DIAGNOSIS — Z7982 Long term (current) use of aspirin: Secondary | ICD-10-CM | POA: Insufficient documentation

## 2021-02-26 DIAGNOSIS — I959 Hypotension, unspecified: Secondary | ICD-10-CM | POA: Diagnosis not present

## 2021-02-26 LAB — CBC
HCT: 38.3 % (ref 36.0–46.0)
Hemoglobin: 12.4 g/dL (ref 12.0–15.0)
MCH: 28.1 pg (ref 26.0–34.0)
MCHC: 32.4 g/dL (ref 30.0–36.0)
MCV: 86.7 fL (ref 80.0–100.0)
Platelets: 188 10*3/uL (ref 150–400)
RBC: 4.42 MIL/uL (ref 3.87–5.11)
RDW: 14.4 % (ref 11.5–15.5)
WBC: 12.2 10*3/uL — ABNORMAL HIGH (ref 4.0–10.5)
nRBC: 0 % (ref 0.0–0.2)

## 2021-02-26 LAB — BASIC METABOLIC PANEL
Anion gap: 6 (ref 5–15)
BUN: 40 mg/dL — ABNORMAL HIGH (ref 8–23)
CO2: 24 mmol/L (ref 22–32)
Calcium: 9.7 mg/dL (ref 8.9–10.3)
Chloride: 106 mmol/L (ref 98–111)
Creatinine, Ser: 1.12 mg/dL — ABNORMAL HIGH (ref 0.44–1.00)
GFR, Estimated: 46 mL/min — ABNORMAL LOW (ref >60)
Glucose, Bld: 115 mg/dL — ABNORMAL HIGH (ref 70–99)
Potassium: 3.9 mmol/L (ref 3.5–5.1)
Sodium: 136 mmol/L (ref 135–145)

## 2021-02-26 LAB — RESP PANEL BY RT-PCR (FLU A&B, COVID) ARPGX2
Influenza A by PCR: NEGATIVE
Influenza B by PCR: NEGATIVE
SARS Coronavirus 2 by RT PCR: NEGATIVE

## 2021-02-26 LAB — CBG MONITORING, ED: Glucose-Capillary: 112 mg/dL — ABNORMAL HIGH (ref 70–99)

## 2021-02-26 MED ORDER — HYDRALAZINE HCL 25 MG PO TABS
50.0000 mg | ORAL_TABLET | Freq: Once | ORAL | Status: AC
  Administered 2021-02-26: 50 mg via ORAL
  Filled 2021-02-26: qty 2

## 2021-02-26 MED ORDER — ENALAPRIL MALEATE 10 MG PO TABS
20.0000 mg | ORAL_TABLET | Freq: Once | ORAL | Status: AC
  Administered 2021-02-27: 20 mg via ORAL
  Filled 2021-02-26: qty 2

## 2021-02-26 MED ORDER — CLONIDINE HCL 0.1 MG PO TABS: 0.1000 mg | ORAL_TABLET | Freq: Once | ORAL | Status: DC
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
## 2021-02-26 NOTE — Discharge Instructions (Addendum)
Call your primary care doctor or specialist as discussed in the next 2-3 days.   Return immediately back to the ER if:  Your symptoms worsen within the next 12-24 hours. You develop new symptoms such as new fevers, persistent vomiting, new pain, shortness of breath, or new weakness or numbness, or if you have any other concerns.  

## 2021-02-27 DIAGNOSIS — S0093XA Contusion of unspecified part of head, initial encounter: Secondary | ICD-10-CM | POA: Diagnosis not present

## 2021-03-05 ENCOUNTER — Ambulatory Visit: Payer: Medicare Other | Admitting: Internal Medicine

## 2021-03-05 ENCOUNTER — Telehealth: Payer: Self-pay | Admitting: Internal Medicine

## 2021-03-05 DIAGNOSIS — R2681 Unsteadiness on feet: Secondary | ICD-10-CM | POA: Diagnosis not present

## 2021-03-05 DIAGNOSIS — M6281 Muscle weakness (generalized): Secondary | ICD-10-CM | POA: Diagnosis not present

## 2021-03-05 DIAGNOSIS — Z9181 History of falling: Secondary | ICD-10-CM | POA: Diagnosis not present

## 2021-03-05 NOTE — Telephone Encounter (Signed)
Ok - can we give a verbal

## 2021-03-05 NOTE — Telephone Encounter (Signed)
ok 

## 2021-03-05 NOTE — Telephone Encounter (Signed)
Due to recent fall and low heart rate of 40/42 bpm at rest, nurse is requesting chest xray  Nurse Garnetta Buddy [phone]

## 2021-03-05 NOTE — Telephone Encounter (Signed)
Home Health verbal orders-caller/Agency: [employer]  Callback number: [phone]  Requesting OT/PT/Skilled nursing/Social Work/Speech: PT  Reason: Recent Fall  Frequency: eval

## 2021-03-05 NOTE — Telephone Encounter (Signed)
Can she be seen sooner someone else -- she should be seen tomorrow (Thursday)

## 2021-03-05 NOTE — Telephone Encounter (Signed)
Verbals given  

## 2021-03-06 DIAGNOSIS — R2681 Unsteadiness on feet: Secondary | ICD-10-CM | POA: Diagnosis not present

## 2021-03-06 DIAGNOSIS — M6281 Muscle weakness (generalized): Secondary | ICD-10-CM | POA: Diagnosis not present

## 2021-03-06 DIAGNOSIS — Z9181 History of falling: Secondary | ICD-10-CM | POA: Diagnosis not present
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
## 2021-03-07 ENCOUNTER — Other Ambulatory Visit: Payer: Self-pay

## 2021-03-07 ENCOUNTER — Ambulatory Visit (INDEPENDENT_AMBULATORY_CARE_PROVIDER_SITE_OTHER): Payer: Medicare Other

## 2021-03-07 ENCOUNTER — Encounter: Payer: Self-pay | Admitting: Internal Medicine

## 2021-03-07 ENCOUNTER — Ambulatory Visit (INDEPENDENT_AMBULATORY_CARE_PROVIDER_SITE_OTHER): Payer: Medicare Other | Admitting: Internal Medicine

## 2021-03-07 VITALS — BP 106/72 | HR 82 | Temp 98.0°F | Ht 66.0 in | Wt 104.0 lb

## 2021-03-07 DIAGNOSIS — R0781 Pleurodynia: Secondary | ICD-10-CM | POA: Diagnosis not present

## 2021-03-07 DIAGNOSIS — Z23 Encounter for immunization: Secondary | ICD-10-CM

## 2021-03-07 DIAGNOSIS — I10 Essential (primary) hypertension: Secondary | ICD-10-CM | POA: Diagnosis not present

## 2021-03-07 DIAGNOSIS — Z9181 History of falling: Secondary | ICD-10-CM | POA: Diagnosis not present

## 2021-03-07 DIAGNOSIS — R5381 Other malaise: Secondary | ICD-10-CM | POA: Diagnosis not present

## 2021-03-07 DIAGNOSIS — R2681 Unsteadiness on feet: Secondary | ICD-10-CM | POA: Diagnosis not present

## 2021-03-07 DIAGNOSIS — M6281 Muscle weakness (generalized): Secondary | ICD-10-CM | POA: Diagnosis not present

## 2021-03-07 DIAGNOSIS — R55 Syncope and collapse: Secondary | ICD-10-CM

## 2021-03-07 NOTE — Assessment & Plan Note (Signed)
Chronic Has gotten slightly worse recently Continue physical therapy-increase number of days per week

## 2021-03-07 NOTE — Patient Instructions (Addendum)
    Have an xray downstairs.    Medications changes include :   none    Continue to monitor BP.      Follow up with cardiology.

## 2021-03-07 NOTE — Assessment & Plan Note (Signed)
Acute Syncopal episode 10/4-it was witnessed, but no unsure if it was as soon as she stood up or if she had been standing for a while Sounds sudden-concern for possible cardiac cause In sinus rhythm here, but has had some tachycardia in the past CT-?  A. fib or other arrhythmia.  Has aortic insufficiency and was mild and I doubt this is gotten much worse, but that is the other concern Advised her to follow-up with her cardiologist No adjustment in meds for now

## 2021-03-07 NOTE — Assessment & Plan Note (Signed)
Acute Left lateral rib pain after fall 2010/4 Contusion versus fracture Will get rib x-ray and chest x-ray to make sure there is no pneumonia Discussed pain control-can take low-dose ibuprofen, but cannot overdo it due to CKD.  Can take Tylenol on top of ibuprofen Monitor for symptoms of pneumonia-does not have any currently

## 2021-03-07 NOTE — Assessment & Plan Note (Signed)
Blood pressure is typically high Not sure why it is low here today and it was low 1 other day Will not make any changes today-continue metoprolol 37.5 mg twice daily, hydralazine 25 mg 3 times daily and enalapril 20 mg twice daily We will see what cardiology thinks regarding her syncopal episode before adjusting

## 2021-03-10 DIAGNOSIS — Z9181 History of falling: Secondary | ICD-10-CM | POA: Diagnosis not present

## 2021-03-10 DIAGNOSIS — M6281 Muscle weakness (generalized): Secondary | ICD-10-CM | POA: Diagnosis not present

## 2021-03-10 DIAGNOSIS — R2681 Unsteadiness on feet: Secondary | ICD-10-CM | POA: Diagnosis not present

## 2021-03-11 ENCOUNTER — Ambulatory Visit: Payer: Medicare Other | Admitting: Cardiology

## 2021-03-11 DIAGNOSIS — R2681 Unsteadiness on feet: Secondary | ICD-10-CM | POA: Diagnosis not present

## 2021-03-11 DIAGNOSIS — M6281 Muscle weakness (generalized): Secondary | ICD-10-CM | POA: Diagnosis not present

## 2021-03-11 DIAGNOSIS — Z9181 History of falling: Secondary | ICD-10-CM | POA: Diagnosis not present

## 2021-03-14 DIAGNOSIS — M6281 Muscle weakness (generalized): Secondary | ICD-10-CM | POA: Diagnosis not present

## 2021-03-14 DIAGNOSIS — Z9181 History of falling: Secondary | ICD-10-CM | POA: Diagnosis not present

## 2021-03-14 DIAGNOSIS — R2681 Unsteadiness on feet: Secondary | ICD-10-CM | POA: Diagnosis not present

## 2021-03-18 DIAGNOSIS — R2681 Unsteadiness on feet: Secondary | ICD-10-CM | POA: Diagnosis not present

## 2021-03-18 DIAGNOSIS — Z9181 History of falling: Secondary | ICD-10-CM | POA: Diagnosis not present

## 2021-03-18 DIAGNOSIS — M6281 Muscle weakness (generalized): Secondary | ICD-10-CM | POA: Diagnosis not present

## 2021-03-19 ENCOUNTER — Other Ambulatory Visit: Payer: Self-pay | Admitting: Cardiology

## 2021-03-20 DIAGNOSIS — Z9181 History of falling: Secondary | ICD-10-CM | POA: Diagnosis not present

## 2021-03-20 DIAGNOSIS — M6281 Muscle weakness (generalized): Secondary | ICD-10-CM | POA: Diagnosis not present

## 2021-03-20 DIAGNOSIS — R2681 Unsteadiness on feet: Secondary | ICD-10-CM | POA: Diagnosis not present
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
## 2021-03-25 ENCOUNTER — Ambulatory Visit: Payer: Medicare Other | Admitting: General Practice

## 2021-03-25 DIAGNOSIS — Z9181 History of falling: Secondary | ICD-10-CM | POA: Diagnosis not present

## 2021-03-25 DIAGNOSIS — M6281 Muscle weakness (generalized): Secondary | ICD-10-CM | POA: Diagnosis not present

## 2021-03-25 DIAGNOSIS — R2681 Unsteadiness on feet: Secondary | ICD-10-CM | POA: Diagnosis not present
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
## 2021-03-28 ENCOUNTER — Ambulatory Visit (INDEPENDENT_AMBULATORY_CARE_PROVIDER_SITE_OTHER): Payer: Medicare Other | Admitting: General Practice

## 2021-03-28 ENCOUNTER — Encounter: Payer: Self-pay | Admitting: General Practice

## 2021-03-28 ENCOUNTER — Other Ambulatory Visit: Payer: Self-pay

## 2021-03-28 VITALS — BP 136/98 | HR 56 | Ht 66.0 in | Wt 97.2 lb

## 2021-03-28 DIAGNOSIS — I479 Paroxysmal tachycardia, unspecified: Secondary | ICD-10-CM | POA: Diagnosis not present

## 2021-03-28 DIAGNOSIS — I10 Essential (primary) hypertension: Secondary | ICD-10-CM | POA: Diagnosis not present

## 2021-03-28 DIAGNOSIS — I359 Nonrheumatic aortic valve disorder, unspecified: Secondary | ICD-10-CM | POA: Diagnosis not present

## 2021-03-28 NOTE — Patient Instructions (Signed)
Medication Instructions:  The current medical regimen is effective;  continue present plan and medications as directed. Please refer to the Current Medication list given to you today.  *If you need a refill on your cardiac medications before your next appointment, please call your pharmacy*  Lab Work:   Testing/Procedures:  NONE    NONE  Special Instructions PAUSE 1-2 MINUTES WHEN CHANGING POSITIONS  INCREASE CALORIES-EXTRA MILKSHAKE, ICE CREAM, ETC....  MAINTAIN HYDRATION  Follow-Up: Your next appointment:  KEEP SCHEDULED APPOINTMENT  In Person with Olga Millers, MD   At [facility], you and your health needs are our priority.  As part of our continuing mission to provide you with exceptional heart care, we have created designated Provider Care Teams.  These Care Teams include your primary Cardiologist (physician) and Advanced Practice Providers (APPs -  Physician Assistants and Nurse Practitioners) who all work together to provide you with the care you need, when you need it.  We recommend signing up for the patient portal called "MyChart".  Sign up information is provided on this After Visit Summary.  MyChart is used to connect with patients for Virtual Visits (Telemedicine).  Patients are able to view lab/test results, encounter notes, upcoming appointments, etc.  Non-urgent messages can be sent to your provider as well.   To learn more about what you can do with MyChart, go to [email or web address].

## 2021-04-01 DIAGNOSIS — Z9181 History of falling: Secondary | ICD-10-CM | POA: Diagnosis not present

## 2021-04-01 DIAGNOSIS — R2681 Unsteadiness on feet: Secondary | ICD-10-CM | POA: Diagnosis not present

## 2021-04-01 DIAGNOSIS — M6281 Muscle weakness (generalized): Secondary | ICD-10-CM | POA: Diagnosis not present

## 2021-04-04 DIAGNOSIS — Z9181 History of falling: Secondary | ICD-10-CM | POA: Diagnosis not present

## 2021-04-04 DIAGNOSIS — M6281 Muscle weakness (generalized): Secondary | ICD-10-CM | POA: Diagnosis not present

## 2021-04-04 DIAGNOSIS — R2681 Unsteadiness on feet: Secondary | ICD-10-CM | POA: Diagnosis not present

## 2021-04-08 DIAGNOSIS — Z9181 History of falling: Secondary | ICD-10-CM | POA: Diagnosis not present

## 2021-04-08 DIAGNOSIS — M6281 Muscle weakness (generalized): Secondary | ICD-10-CM | POA: Diagnosis not present

## 2021-04-08 DIAGNOSIS — R2681 Unsteadiness on feet: Secondary | ICD-10-CM | POA: Diagnosis not present

## 2021-04-10 DIAGNOSIS — R2681 Unsteadiness on feet: Secondary | ICD-10-CM | POA: Diagnosis not present

## 2021-04-10 DIAGNOSIS — Z9181 History of falling: Secondary | ICD-10-CM | POA: Diagnosis not present

## 2021-04-10 DIAGNOSIS — M6281 Muscle weakness (generalized): Secondary | ICD-10-CM | POA: Diagnosis not present

## 2021-04-13 ENCOUNTER — Other Ambulatory Visit: Payer: Self-pay | Admitting: Cardiology

## 2021-04-15 DIAGNOSIS — Z9181 History of falling: Secondary | ICD-10-CM | POA: Diagnosis not present

## 2021-04-15 DIAGNOSIS — M6281 Muscle weakness (generalized): Secondary | ICD-10-CM | POA: Diagnosis not present

## 2021-04-15 DIAGNOSIS — R2681 Unsteadiness on feet: Secondary | ICD-10-CM | POA: Diagnosis not present

## 2021-04-22 DIAGNOSIS — Z9181 History of falling: Secondary | ICD-10-CM | POA: Diagnosis not present

## 2021-04-22 DIAGNOSIS — R2681 Unsteadiness on feet: Secondary | ICD-10-CM | POA: Diagnosis not present

## 2021-04-22 DIAGNOSIS — M6281 Muscle weakness (generalized): Secondary | ICD-10-CM | POA: Diagnosis not present

## 2021-04-24 DIAGNOSIS — R2681 Unsteadiness on feet: Secondary | ICD-10-CM | POA: Diagnosis not present

## 2021-04-24 DIAGNOSIS — Z9181 History of falling: Secondary | ICD-10-CM | POA: Diagnosis not present

## 2021-04-24 DIAGNOSIS — M6281 Muscle weakness (generalized): Secondary | ICD-10-CM | POA: Diagnosis not present

## 2021-04-29 DIAGNOSIS — Z9181 History of falling: Secondary | ICD-10-CM | POA: Diagnosis not present

## 2021-04-29 DIAGNOSIS — R2681 Unsteadiness on feet: Secondary | ICD-10-CM | POA: Diagnosis not present

## 2021-04-29 DIAGNOSIS — M6281 Muscle weakness (generalized): Secondary | ICD-10-CM | POA: Diagnosis not present

## 2021-05-01 DIAGNOSIS — Z9181 History of falling: Secondary | ICD-10-CM | POA: Diagnosis not present

## 2021-05-01 DIAGNOSIS — R2681 Unsteadiness on feet: Secondary | ICD-10-CM | POA: Diagnosis not present

## 2021-05-01 DIAGNOSIS — M6281 Muscle weakness (generalized): Secondary | ICD-10-CM | POA: Diagnosis not present

## 2021-05-06 DIAGNOSIS — R2681 Unsteadiness on feet: Secondary | ICD-10-CM | POA: Diagnosis not present

## 2021-05-06 DIAGNOSIS — M6281 Muscle weakness (generalized): Secondary | ICD-10-CM | POA: Diagnosis not present

## 2021-05-06 DIAGNOSIS — Z9181 History of falling: Secondary | ICD-10-CM | POA: Diagnosis not present

## 2021-05-07 DIAGNOSIS — Z20828 Contact with and (suspected) exposure to other viral communicable diseases: Secondary | ICD-10-CM | POA: Diagnosis not present

## 2021-05-07 DIAGNOSIS — Z1159 Encounter for screening for other viral diseases: Secondary | ICD-10-CM | POA: Diagnosis not present

## 2021-05-08 ENCOUNTER — Encounter: Payer: Self-pay | Admitting: Cardiology

## 2021-05-08 ENCOUNTER — Other Ambulatory Visit: Payer: Self-pay | Admitting: Cardiology

## 2021-05-09 DIAGNOSIS — R2681 Unsteadiness on feet: Secondary | ICD-10-CM | POA: Diagnosis not present

## 2021-05-09 DIAGNOSIS — Z9181 History of falling: Secondary | ICD-10-CM | POA: Diagnosis not present

## 2021-05-09 DIAGNOSIS — M6281 Muscle weakness (generalized): Secondary | ICD-10-CM | POA: Diagnosis not present

## 2021-05-15 DIAGNOSIS — E8770 Fluid overload, unspecified: Secondary | ICD-10-CM | POA: Diagnosis not present

## 2021-05-15 DIAGNOSIS — I352 Nonrheumatic aortic (valve) stenosis with insufficiency: Secondary | ICD-10-CM | POA: Diagnosis not present

## 2021-05-15 DIAGNOSIS — Z743 Need for continuous supervision: Secondary | ICD-10-CM | POA: Diagnosis not present

## 2021-05-15 DIAGNOSIS — R140 Abdominal distension (gaseous): Secondary | ICD-10-CM | POA: Diagnosis not present

## 2021-05-15 DIAGNOSIS — J9811 Atelectasis: Secondary | ICD-10-CM | POA: Diagnosis not present

## 2021-05-15 DIAGNOSIS — I10 Essential (primary) hypertension: Secondary | ICD-10-CM | POA: Diagnosis not present

## 2021-05-15 DIAGNOSIS — N179 Acute kidney failure, unspecified: Secondary | ICD-10-CM | POA: Diagnosis not present

## 2021-05-15 DIAGNOSIS — K567 Ileus, unspecified: Secondary | ICD-10-CM | POA: Diagnosis not present

## 2021-05-15 DIAGNOSIS — R531 Weakness: Secondary | ICD-10-CM | POA: Diagnosis not present

## 2021-05-15 DIAGNOSIS — Z7982 Long term (current) use of aspirin: Secondary | ICD-10-CM | POA: Diagnosis not present

## 2021-05-15 DIAGNOSIS — A4189 Other specified sepsis: Secondary | ICD-10-CM | POA: Diagnosis not present

## 2021-05-15 DIAGNOSIS — J90 Pleural effusion, not elsewhere classified: Secondary | ICD-10-CM | POA: Diagnosis not present

## 2021-05-15 DIAGNOSIS — U071 COVID-19: Secondary | ICD-10-CM | POA: Diagnosis not present

## 2021-05-15 DIAGNOSIS — I739 Peripheral vascular disease, unspecified: Secondary | ICD-10-CM | POA: Diagnosis not present

## 2021-05-15 DIAGNOSIS — I700 Atherosclerosis of aorta: Secondary | ICD-10-CM | POA: Diagnosis not present

## 2021-05-15 DIAGNOSIS — R0902 Hypoxemia: Secondary | ICD-10-CM | POA: Diagnosis not present

## 2021-05-15 DIAGNOSIS — I499 Cardiac arrhythmia, unspecified: Secondary | ICD-10-CM | POA: Diagnosis not present

## 2021-05-15 DIAGNOSIS — Z66 Do not resuscitate: Secondary | ICD-10-CM | POA: Diagnosis not present

## 2021-05-15 DIAGNOSIS — R000 Tachycardia, unspecified: Secondary | ICD-10-CM | POA: Diagnosis not present

## 2021-05-15 DIAGNOSIS — E861 Hypovolemia: Secondary | ICD-10-CM | POA: Diagnosis not present

## 2021-05-15 DIAGNOSIS — J8410 Pulmonary fibrosis, unspecified: Secondary | ICD-10-CM | POA: Diagnosis not present

## 2021-05-15 DIAGNOSIS — I358 Other nonrheumatic aortic valve disorders: Secondary | ICD-10-CM | POA: Diagnosis not present

## 2021-05-15 DIAGNOSIS — B351 Tinea unguium: Secondary | ICD-10-CM | POA: Diagnosis not present

## 2021-05-15 DIAGNOSIS — J1282 Pneumonia due to coronavirus disease 2019: Secondary | ICD-10-CM | POA: Diagnosis not present

## 2021-05-15 DIAGNOSIS — I3139 Other pericardial effusion (noninflammatory): Secondary | ICD-10-CM | POA: Diagnosis not present

## 2021-05-15 DIAGNOSIS — J181 Lobar pneumonia, unspecified organism: Secondary | ICD-10-CM | POA: Diagnosis not present

## 2021-05-15 DIAGNOSIS — I517 Cardiomegaly: Secondary | ICD-10-CM | POA: Diagnosis not present

## 2021-05-15 DIAGNOSIS — I951 Orthostatic hypotension: Secondary | ICD-10-CM | POA: Diagnosis not present

## 2021-05-15 DIAGNOSIS — Z8612 Personal history of poliomyelitis: Secondary | ICD-10-CM | POA: Diagnosis not present

## 2021-05-15 DIAGNOSIS — J189 Pneumonia, unspecified organism: Secondary | ICD-10-CM | POA: Diagnosis not present

## 2021-05-15 DIAGNOSIS — I7789 Other specified disorders of arteries and arterioles: Secondary | ICD-10-CM | POA: Diagnosis not present

## 2021-05-15 DIAGNOSIS — A419 Sepsis, unspecified organism: Secondary | ICD-10-CM | POA: Diagnosis not present

## 2021-05-15 DIAGNOSIS — R6521 Severe sepsis with septic shock: Secondary | ICD-10-CM | POA: Diagnosis not present

## 2021-05-15 DIAGNOSIS — I160 Hypertensive urgency: Secondary | ICD-10-CM | POA: Diagnosis not present

## 2021-05-15 DIAGNOSIS — I4891 Unspecified atrial fibrillation: Secondary | ICD-10-CM | POA: Diagnosis not present

## 2021-05-15 DIAGNOSIS — T502X5A Adverse effect of carbonic-anhydrase inhibitors, benzothiadiazides and other diuretics, initial encounter: Secondary | ICD-10-CM | POA: Diagnosis not present

## 2021-05-15 DIAGNOSIS — E872 Acidosis, unspecified: Secondary | ICD-10-CM | POA: Diagnosis not present

## 2021-05-15 DIAGNOSIS — R0689 Other abnormalities of breathing: Secondary | ICD-10-CM | POA: Diagnosis not present

## 2021-05-15 DIAGNOSIS — I213 ST elevation (STEMI) myocardial infarction of unspecified site: Secondary | ICD-10-CM | POA: Diagnosis not present

## 2021-05-15 DIAGNOSIS — J9601 Acute respiratory failure with hypoxia: Secondary | ICD-10-CM | POA: Diagnosis not present

## 2021-05-15 DIAGNOSIS — K5900 Constipation, unspecified: Secondary | ICD-10-CM | POA: Diagnosis not present

## 2021-05-15 DIAGNOSIS — Z681 Body mass index (BMI) 19 or less, adult: Secondary | ICD-10-CM | POA: Diagnosis not present

## 2021-05-15 DIAGNOSIS — I351 Nonrheumatic aortic (valve) insufficiency: Secondary | ICD-10-CM | POA: Diagnosis not present

## 2021-05-16 DIAGNOSIS — U071 COVID-19: Secondary | ICD-10-CM | POA: Diagnosis not present

## 2021-05-16 DIAGNOSIS — N179 Acute kidney failure, unspecified: Secondary | ICD-10-CM | POA: Diagnosis not present

## 2021-05-16 DIAGNOSIS — E872 Acidosis, unspecified: Secondary | ICD-10-CM | POA: Diagnosis not present

## 2021-05-16 DIAGNOSIS — J1282 Pneumonia due to coronavirus disease 2019: Secondary | ICD-10-CM | POA: Diagnosis not present

## 2021-05-16 DIAGNOSIS — J9601 Acute respiratory failure with hypoxia: Secondary | ICD-10-CM | POA: Diagnosis not present

## 2021-05-16 DIAGNOSIS — A419 Sepsis, unspecified organism: Secondary | ICD-10-CM | POA: Diagnosis not present

## 2021-05-17 DIAGNOSIS — E872 Acidosis, unspecified: Secondary | ICD-10-CM | POA: Diagnosis not present

## 2021-05-17 DIAGNOSIS — A419 Sepsis, unspecified organism: Secondary | ICD-10-CM | POA: Diagnosis not present

## 2021-05-17 DIAGNOSIS — J9601 Acute respiratory failure with hypoxia: Secondary | ICD-10-CM | POA: Diagnosis not present

## 2021-05-17 DIAGNOSIS — U071 COVID-19: Secondary | ICD-10-CM | POA: Diagnosis not present

## 2021-05-17 DIAGNOSIS — J1282 Pneumonia due to coronavirus disease 2019: Secondary | ICD-10-CM | POA: Diagnosis not present

## 2021-05-17 DIAGNOSIS — N179 Acute kidney failure, unspecified: Secondary | ICD-10-CM | POA: Diagnosis not present

## 2021-05-18 DIAGNOSIS — A419 Sepsis, unspecified organism: Secondary | ICD-10-CM | POA: Diagnosis not present

## 2021-05-18 DIAGNOSIS — J1282 Pneumonia due to coronavirus disease 2019: Secondary | ICD-10-CM | POA: Diagnosis not present

## 2021-05-18 DIAGNOSIS — U071 COVID-19: Secondary | ICD-10-CM | POA: Diagnosis not present

## 2021-05-18 DIAGNOSIS — J9601 Acute respiratory failure with hypoxia: Secondary | ICD-10-CM | POA: Diagnosis not present

## 2021-05-19 DIAGNOSIS — J1282 Pneumonia due to coronavirus disease 2019: Secondary | ICD-10-CM | POA: Diagnosis not present

## 2021-05-19 DIAGNOSIS — I517 Cardiomegaly: Secondary | ICD-10-CM | POA: Diagnosis not present

## 2021-05-19 DIAGNOSIS — U071 COVID-19: Secondary | ICD-10-CM | POA: Diagnosis not present

## 2021-05-19 DIAGNOSIS — I3139 Other pericardial effusion (noninflammatory): Secondary | ICD-10-CM | POA: Diagnosis not present

## 2021-05-19 DIAGNOSIS — A419 Sepsis, unspecified organism: Secondary | ICD-10-CM | POA: Diagnosis not present

## 2021-05-19 DIAGNOSIS — J9601 Acute respiratory failure with hypoxia: Secondary | ICD-10-CM | POA: Diagnosis not present

## 2021-05-19 DIAGNOSIS — I358 Other nonrheumatic aortic valve disorders: Secondary | ICD-10-CM | POA: Diagnosis not present

## 2021-05-19 DIAGNOSIS — I352 Nonrheumatic aortic (valve) stenosis with insufficiency: Secondary | ICD-10-CM | POA: Diagnosis not present

## 2021-05-20 DIAGNOSIS — J1282 Pneumonia due to coronavirus disease 2019: Secondary | ICD-10-CM | POA: Diagnosis not present

## 2021-05-20 DIAGNOSIS — J9601 Acute respiratory failure with hypoxia: Secondary | ICD-10-CM | POA: Diagnosis not present

## 2021-05-20 DIAGNOSIS — U071 COVID-19: Secondary | ICD-10-CM | POA: Diagnosis not present

## 2021-05-20 DIAGNOSIS — A419 Sepsis, unspecified organism: Secondary | ICD-10-CM | POA: Diagnosis not present

## 2021-05-20 DIAGNOSIS — I7789 Other specified disorders of arteries and arterioles: Secondary | ICD-10-CM | POA: Diagnosis not present

## 2021-05-21 DIAGNOSIS — J90 Pleural effusion, not elsewhere classified: Secondary | ICD-10-CM | POA: Diagnosis not present

## 2021-05-21 DIAGNOSIS — U071 COVID-19: Secondary | ICD-10-CM | POA: Diagnosis not present

## 2021-05-21 DIAGNOSIS — J9601 Acute respiratory failure with hypoxia: Secondary | ICD-10-CM | POA: Diagnosis not present

## 2021-05-21 DIAGNOSIS — J1282 Pneumonia due to coronavirus disease 2019: Secondary | ICD-10-CM | POA: Diagnosis not present

## 2021-05-21 DIAGNOSIS — A419 Sepsis, unspecified organism: Secondary | ICD-10-CM | POA: Diagnosis not present

## 2021-05-22 DIAGNOSIS — U071 COVID-19: Secondary | ICD-10-CM | POA: Diagnosis not present

## 2021-05-22 DIAGNOSIS — J9601 Acute respiratory failure with hypoxia: Secondary | ICD-10-CM | POA: Diagnosis not present

## 2021-05-22 DIAGNOSIS — J1282 Pneumonia due to coronavirus disease 2019: Secondary | ICD-10-CM | POA: Diagnosis not present

## 2021-05-22 DIAGNOSIS — A419 Sepsis, unspecified organism: Secondary | ICD-10-CM | POA: Diagnosis not present

## 2021-05-23 DIAGNOSIS — J8410 Pulmonary fibrosis, unspecified: Secondary | ICD-10-CM | POA: Diagnosis not present

## 2021-05-23 DIAGNOSIS — J1282 Pneumonia due to coronavirus disease 2019: Secondary | ICD-10-CM | POA: Diagnosis not present

## 2021-05-23 DIAGNOSIS — K5900 Constipation, unspecified: Secondary | ICD-10-CM | POA: Diagnosis not present

## 2021-05-23 DIAGNOSIS — J9601 Acute respiratory failure with hypoxia: Secondary | ICD-10-CM | POA: Diagnosis not present

## 2021-05-23 DIAGNOSIS — J9811 Atelectasis: Secondary | ICD-10-CM | POA: Diagnosis not present

## 2021-05-23 DIAGNOSIS — A419 Sepsis, unspecified organism: Secondary | ICD-10-CM | POA: Diagnosis not present

## 2021-05-23 DIAGNOSIS — J90 Pleural effusion, not elsewhere classified: Secondary | ICD-10-CM | POA: Diagnosis not present

## 2021-05-23 DIAGNOSIS — U071 COVID-19: Secondary | ICD-10-CM | POA: Diagnosis not present

## 2021-05-24 DIAGNOSIS — J1282 Pneumonia due to coronavirus disease 2019: Secondary | ICD-10-CM | POA: Diagnosis not present

## 2021-05-24 DIAGNOSIS — A419 Sepsis, unspecified organism: Secondary | ICD-10-CM | POA: Diagnosis not present

## 2021-05-24 DIAGNOSIS — U071 COVID-19: Secondary | ICD-10-CM | POA: Diagnosis not present

## 2021-05-24 DIAGNOSIS — J9601 Acute respiratory failure with hypoxia: Secondary | ICD-10-CM | POA: Diagnosis not present

## 2021-05-25 DIAGNOSIS — J9601 Acute respiratory failure with hypoxia: Secondary | ICD-10-CM | POA: Diagnosis not present

## 2021-05-25 DIAGNOSIS — U071 COVID-19: Secondary | ICD-10-CM | POA: Diagnosis not present

## 2021-05-25 DIAGNOSIS — J1282 Pneumonia due to coronavirus disease 2019: Secondary | ICD-10-CM | POA: Diagnosis not present

## 2021-05-25 DIAGNOSIS — A419 Sepsis, unspecified organism: Secondary | ICD-10-CM | POA: Diagnosis not present

## 2021-05-26 DIAGNOSIS — A419 Sepsis, unspecified organism: Secondary | ICD-10-CM | POA: Diagnosis not present

## 2021-05-26 DIAGNOSIS — J1282 Pneumonia due to coronavirus disease 2019: Secondary | ICD-10-CM | POA: Diagnosis not present

## 2021-05-26 DIAGNOSIS — U071 COVID-19: Secondary | ICD-10-CM | POA: Diagnosis not present

## 2021-05-26 DIAGNOSIS — J9601 Acute respiratory failure with hypoxia: Secondary | ICD-10-CM | POA: Diagnosis not present

## 2021-05-27 DIAGNOSIS — U071 COVID-19: Secondary | ICD-10-CM | POA: Diagnosis not present

## 2021-05-27 DIAGNOSIS — A419 Sepsis, unspecified organism: Secondary | ICD-10-CM | POA: Diagnosis not present

## 2021-05-27 DIAGNOSIS — J9601 Acute respiratory failure with hypoxia: Secondary | ICD-10-CM | POA: Diagnosis not present

## 2021-05-27 DIAGNOSIS — J1282 Pneumonia due to coronavirus disease 2019: Secondary | ICD-10-CM | POA: Diagnosis not present

## 2021-05-28 DIAGNOSIS — U071 COVID-19: Secondary | ICD-10-CM | POA: Diagnosis not present

## 2021-05-28 DIAGNOSIS — J1282 Pneumonia due to coronavirus disease 2019: Secondary | ICD-10-CM | POA: Diagnosis not present

## 2021-05-28 DIAGNOSIS — J9601 Acute respiratory failure with hypoxia: Secondary | ICD-10-CM | POA: Diagnosis not present

## 2021-05-28 DIAGNOSIS — A419 Sepsis, unspecified organism: Secondary | ICD-10-CM | POA: Diagnosis not present

## 2021-05-29 DIAGNOSIS — U071 COVID-19: Secondary | ICD-10-CM | POA: Diagnosis not present

## 2021-05-29 DIAGNOSIS — R140 Abdominal distension (gaseous): Secondary | ICD-10-CM | POA: Diagnosis not present

## 2021-05-29 DIAGNOSIS — J1282 Pneumonia due to coronavirus disease 2019: Secondary | ICD-10-CM | POA: Diagnosis not present

## 2021-05-29 DIAGNOSIS — A419 Sepsis, unspecified organism: Secondary | ICD-10-CM | POA: Diagnosis not present

## 2021-05-29 DIAGNOSIS — J9601 Acute respiratory failure with hypoxia: Secondary | ICD-10-CM | POA: Diagnosis not present

## 2021-05-30 DIAGNOSIS — J9601 Acute respiratory failure with hypoxia: Secondary | ICD-10-CM | POA: Diagnosis not present

## 2021-05-30 DIAGNOSIS — J1282 Pneumonia due to coronavirus disease 2019: Secondary | ICD-10-CM | POA: Diagnosis not present

## 2021-05-30 DIAGNOSIS — A419 Sepsis, unspecified organism: Secondary | ICD-10-CM | POA: Diagnosis not present

## 2021-05-30 DIAGNOSIS — U071 COVID-19: Secondary | ICD-10-CM | POA: Diagnosis not present

## 2021-05-31 DIAGNOSIS — J1282 Pneumonia due to coronavirus disease 2019: Secondary | ICD-10-CM | POA: Diagnosis not present

## 2021-05-31 DIAGNOSIS — A419 Sepsis, unspecified organism: Secondary | ICD-10-CM | POA: Diagnosis not present

## 2021-05-31 DIAGNOSIS — U071 COVID-19: Secondary | ICD-10-CM | POA: Diagnosis not present

## 2021-05-31 DIAGNOSIS — J9601 Acute respiratory failure with hypoxia: Secondary | ICD-10-CM | POA: Diagnosis not present

## 2021-06-01 DIAGNOSIS — A419 Sepsis, unspecified organism: Secondary | ICD-10-CM | POA: Diagnosis not present

## 2021-06-01 DIAGNOSIS — J1282 Pneumonia due to coronavirus disease 2019: Secondary | ICD-10-CM | POA: Diagnosis not present

## 2021-06-01 DIAGNOSIS — U071 COVID-19: Secondary | ICD-10-CM | POA: Diagnosis not present

## 2021-06-01 DIAGNOSIS — J9601 Acute respiratory failure with hypoxia: Secondary | ICD-10-CM | POA: Diagnosis not present

## 2021-06-02 ENCOUNTER — Other Ambulatory Visit: Payer: Self-pay | Admitting: Cardiology

## 2021-06-02 DIAGNOSIS — J1282 Pneumonia due to coronavirus disease 2019: Secondary | ICD-10-CM | POA: Diagnosis not present

## 2021-06-02 DIAGNOSIS — Z743 Need for continuous supervision: Secondary | ICD-10-CM | POA: Diagnosis not present

## 2021-06-02 DIAGNOSIS — A419 Sepsis, unspecified organism: Secondary | ICD-10-CM | POA: Diagnosis not present

## 2021-06-02 DIAGNOSIS — R531 Weakness: Secondary | ICD-10-CM | POA: Diagnosis not present

## 2021-06-02 DIAGNOSIS — U071 COVID-19: Secondary | ICD-10-CM | POA: Diagnosis not present

## 2021-06-02 DIAGNOSIS — J9601 Acute respiratory failure with hypoxia: Secondary | ICD-10-CM | POA: Diagnosis not present

## 2021-06-13 ENCOUNTER — Telehealth: Payer: Self-pay | Admitting: Internal Medicine

## 2021-06-13 NOTE — Telephone Encounter (Signed)
Medical records notified of passing so they can mark chart.

## 2021-06-13 NOTE — Telephone Encounter (Signed)
Patient daughter in law Bera Pinela calling in  Calling to notify that patient passed away at [facility] Jun 20, 2021  CB # if needed [phone]

## 2021-06-15 ENCOUNTER — Other Ambulatory Visit: Payer: Self-pay | Admitting: Cardiology

## 2021-06-25 DEATH — deceased

## 2021-08-06 ENCOUNTER — Ambulatory Visit: Payer: Medicare Other | Admitting: Cardiology

## 2021-09-29 ENCOUNTER — Ambulatory Visit: Payer: Medicare Other | Admitting: Cardiology

## 2022-01-20 ENCOUNTER — Ambulatory Visit: Payer: Medicare Other | Admitting: Internal Medicine
